# Patient Record
Sex: Female | Born: 1960 | Race: White | Hispanic: No | Marital: Married
Health system: Midwestern US, Academic
[De-identification: ages and names within clinical notes are randomized; demographics above are authoritative.]

---

## 2017-04-01 MED ORDER — TRIAMCINOLONE ACETONIDE 0.1 % TP OINT
Freq: Two times a day (BID) | TOPICAL | 3 refills | Status: DC
Start: 2017-04-01 — End: 2017-07-28

## 2017-04-05 MED ORDER — CARVEDILOL 12.5 MG PO TAB
ORAL_TABLET | Freq: Two times a day (BID) | ORAL | 0 refills | 90.00000 days | Status: DC
Start: 2017-04-05 — End: 2017-07-01

## 2017-04-11 MED ORDER — SULFAMETHOXAZOLE-TRIMETHOPRIM 800-160 MG PO TAB
1 | ORAL_TABLET | Freq: Two times a day (BID) | ORAL | 0 refills | Status: AC
Start: 2017-04-11 — End: ?

## 2017-04-28 MED ORDER — CIPROFLOXACIN HCL 500 MG PO TAB
500 mg | ORAL_TABLET | Freq: Two times a day (BID) | ORAL | 0 refills | 10.00000 days | Status: AC
Start: 2017-04-28 — End: ?

## 2017-04-28 MED ORDER — HYDROXYCHLOROQUINE 200 MG PO TAB
200 mg | ORAL_TABLET | Freq: Two times a day (BID) | ORAL | 3 refills | 90.00000 days | Status: DC
Start: 2017-04-28 — End: 2017-07-28

## 2017-06-07 ENCOUNTER — Encounter
Admit: 2017-06-07 | Discharge: 2017-06-07 | Payer: Private Health Insurance - Indemnity | Primary: Student in an Organized Health Care Education/Training Program

## 2017-06-07 ENCOUNTER — Ambulatory Visit
Admit: 2017-06-07 | Discharge: 2017-06-07 | Payer: Commercial Managed Care - HMO | Primary: Student in an Organized Health Care Education/Training Program

## 2017-06-07 DIAGNOSIS — L97529 Non-pressure chronic ulcer of other part of left foot with unspecified severity: ICD-10-CM

## 2017-06-07 DIAGNOSIS — K219 Gastro-esophageal reflux disease without esophagitis: ICD-10-CM

## 2017-06-07 DIAGNOSIS — E118 Type 2 diabetes mellitus with unspecified complications: Principal | ICD-10-CM

## 2017-06-07 DIAGNOSIS — I776 Arteritis, unspecified: ICD-10-CM

## 2017-06-07 DIAGNOSIS — E785 Hyperlipidemia, unspecified: ICD-10-CM

## 2017-06-07 DIAGNOSIS — I2510 Atherosclerotic heart disease of native coronary artery without angina pectoris: ICD-10-CM

## 2017-06-07 DIAGNOSIS — F329 Major depressive disorder, single episode, unspecified: ICD-10-CM

## 2017-06-07 DIAGNOSIS — F419 Anxiety disorder, unspecified: ICD-10-CM

## 2017-06-07 DIAGNOSIS — S62109A Fracture of unspecified carpal bone, unspecified wrist, initial encounter for closed fracture: ICD-10-CM

## 2017-06-07 DIAGNOSIS — Z9884 Bariatric surgery status: ICD-10-CM

## 2017-06-07 DIAGNOSIS — E1165 Type 2 diabetes mellitus with hyperglycemia: ICD-10-CM

## 2017-06-07 DIAGNOSIS — I10 Essential (primary) hypertension: ICD-10-CM

## 2017-06-07 DIAGNOSIS — T7840XA Allergy, unspecified, initial encounter: ICD-10-CM

## 2017-06-07 DIAGNOSIS — G709 Myoneural disorder, unspecified: ICD-10-CM

## 2017-06-07 DIAGNOSIS — Z9289 Personal history of other medical treatment: ICD-10-CM

## 2017-06-07 LAB — HEMOGLOBIN A1C: Lab: 5.5 % (ref 4.0–6.2)

## 2017-06-10 ENCOUNTER — Encounter
Admit: 2017-06-10 | Discharge: 2017-06-10 | Payer: Private Health Insurance - Indemnity | Primary: Student in an Organized Health Care Education/Training Program

## 2017-07-01 ENCOUNTER — Encounter
Admit: 2017-07-01 | Discharge: 2017-07-01 | Payer: Private Health Insurance - Indemnity | Primary: Student in an Organized Health Care Education/Training Program

## 2017-07-01 MED ORDER — CARVEDILOL 12.5 MG PO TAB
ORAL_TABLET | Freq: Two times a day (BID) | ORAL | 2 refills | 90.00000 days | Status: AC
Start: 2017-07-01 — End: ?

## 2017-07-13 ENCOUNTER — Encounter
Admit: 2017-07-13 | Discharge: 2017-07-13 | Payer: Private Health Insurance - Indemnity | Primary: Student in an Organized Health Care Education/Training Program

## 2017-07-14 ENCOUNTER — Encounter
Admit: 2017-07-14 | Discharge: 2017-07-14 | Payer: Private Health Insurance - Indemnity | Primary: Student in an Organized Health Care Education/Training Program

## 2017-07-14 NOTE — Telephone Encounter
Forwarded to Dr. Benedict Mahoney for review.  Delma Officeronda L Mahoney  Sara Nath, MD 23 hours ago (8:15 AM)        I need to have outpatient surgery on my achilles tendon. In order to have this done they are requesting cardiac clearance. I am hoping to have the procedure in the next few weeks so could you please at your earliest convenience send the clearance to:     [facility]   Lonia Bloodusty Christensen DPM   [address]   [address]   [place], [place] [id number]     Thank you,   Sara BealRonda Mahoney   [phone]

## 2017-07-18 ENCOUNTER — Inpatient Hospital Stay
Admit: 2017-07-18 | Discharge: 2017-07-18 | Payer: Private Health Insurance - Indemnity | Primary: Student in an Organized Health Care Education/Training Program

## 2017-07-18 ENCOUNTER — Encounter
Admit: 2017-07-18 | Discharge: 2017-07-18 | Payer: Private Health Insurance - Indemnity | Primary: Student in an Organized Health Care Education/Training Program

## 2017-07-18 ENCOUNTER — Emergency Department: Admit: 2017-07-18 | Discharge: 2017-07-18 | Payer: Private Health Insurance - Indemnity

## 2017-07-18 DIAGNOSIS — I10 Essential (primary) hypertension: ICD-10-CM

## 2017-07-18 LAB — COMPREHENSIVE METABOLIC PANEL
Lab: 0.5 mg/dL (ref 0.3–1.2)
Lab: 0.5 mg/dL (ref 0.4–1.00)
Lab: 10 mg/dL (ref 8.5–10.6)
Lab: 139 MMOL/L (ref 137–147)
Lab: 14 mg/dL (ref 7–25)
Lab: 24 U/L (ref 7–40)
Lab: 26 U/L (ref 7–56)
Lab: 28 MMOL/L (ref 21–30)
Lab: 4.4 g/dL (ref 3.5–5.0)
Lab: 7 10*3/uL (ref 3–12)
Lab: 8 g/dL (ref 6.0–8.0)
Lab: 81 U/L — ABNORMAL HIGH (ref 25–110)
Lab: 88 mg/dL (ref 70–100)
Lab: >60 mL/min (ref >60)
Lab: >60 mL/min (ref >60)

## 2017-07-18 LAB — POC TROPONIN: Lab: 0 ng/mL (ref 0.00–0.05)

## 2017-07-18 LAB — D-DIMER: Lab: 535 ng{FEU}/mL — ABNORMAL HIGH (ref <500)

## 2017-07-18 LAB — PROTIME INR (PT): Lab: 1 MMOL/L — ABNORMAL LOW (ref 0.8–1.2)

## 2017-07-18 LAB — PTT (APTT): Lab: 33 s (ref 21.0–39.0)

## 2017-07-18 LAB — CBC AND DIFF
Lab: 0 10*3/uL (ref 0–0.20)
Lab: 0.2 10*3/uL (ref 0–0.45)
Lab: 9.6 10*3/uL (ref 4.5–11.0)

## 2017-07-18 LAB — LIPASE: Lab: 16 U/L (ref 11–82)

## 2017-07-18 MED ORDER — MAGNESIUM SULFATE IN D5W 1 GRAM/100 ML IV PGBK
1 g | Freq: Once | INTRAVENOUS | 0 refills | Status: CP
Start: 2017-07-18 — End: ?
  Administered 2017-07-19: 05:00:00 1 g via INTRAVENOUS

## 2017-07-18 MED ORDER — IOPAMIDOL 76 % IV SOLN
75 mL | Freq: Once | INTRAVENOUS | 0 refills | Status: CP
Start: 2017-07-18 — End: ?
  Administered 2017-07-19: 04:00:00 75 mL via INTRAVENOUS

## 2017-07-18 MED ORDER — ENOXAPARIN 40 MG/0.4 ML SC SYRG
40 mg | Freq: Every day | SUBCUTANEOUS | 0 refills | Status: DC
Start: 2017-07-18 — End: 2017-07-19
  Administered 2017-07-19: 05:00:00 40 mg via SUBCUTANEOUS

## 2017-07-18 MED ORDER — MELATONIN 3 MG PO TAB
3 mg | Freq: Every evening | ORAL | 0 refills | Status: DC | PRN
Start: 2017-07-18 — End: 2017-07-19
  Administered 2017-07-19: 05:00:00 3 mg via ORAL

## 2017-07-18 MED ORDER — FERROUS SULFATE 300 MG (60 MG IRON)/5 ML PO LIQD
300 mg | Freq: Every day | ORAL | 0 refills | Status: DC
Start: 2017-07-18 — End: 2017-07-19
  Administered 2017-07-19: 15:00:00 300 mg via ORAL

## 2017-07-18 MED ORDER — BUPROPION XL 150 MG PO TB24
150 mg | Freq: Every day | ORAL | 0 refills | Status: DC
Start: 2017-07-18 — End: 2017-07-19
  Administered 2017-07-19: 15:00:00 150 mg via ORAL

## 2017-07-18 MED ORDER — ASCORBIC ACID (VITAMIN C) 500 MG PO TAB
500 mg | Freq: Every day | ORAL | 0 refills | Status: DC
Start: 2017-07-18 — End: 2017-07-19
  Administered 2017-07-19: 15:00:00 500 mg via ORAL

## 2017-07-18 MED ORDER — PANTOPRAZOLE 40 MG PO TBEC
80 mg | Freq: Every day | ORAL | 0 refills | Status: CN
Start: 2017-07-18 — End: ?

## 2017-07-18 MED ORDER — LOSARTAN 25 MG PO TAB
25 mg | Freq: Every day | ORAL | 0 refills | Status: DC
Start: 2017-07-18 — End: 2017-07-19

## 2017-07-18 MED ORDER — POTASSIUM CHLORIDE 20 MEQ PO TBTQ
60 meq | Freq: Once | ORAL | 0 refills | Status: CP
Start: 2017-07-18 — End: ?
  Administered 2017-07-19: 05:00:00 60 meq via ORAL

## 2017-07-18 MED ORDER — SODIUM CHLORIDE 0.9 % IJ SOLN
50 mL | Freq: Once | INTRAVENOUS | 0 refills | Status: CP
Start: 2017-07-18 — End: ?
  Administered 2017-07-19: 04:00:00 50 mL via INTRAVENOUS

## 2017-07-18 MED ORDER — CARVEDILOL 12.5 MG PO TAB
12.5 mg | Freq: Two times a day (BID) | ORAL | 0 refills | Status: DC
Start: 2017-07-18 — End: 2017-07-19

## 2017-07-18 MED ORDER — HYDRALAZINE 20 MG/ML IJ SOLN
10 mg | INTRAVENOUS | 0 refills | Status: DC | PRN
Start: 2017-07-18 — End: 2017-07-19

## 2017-07-18 MED ORDER — ASPIRIN 81 MG PO TBEC
81 mg | Freq: Every day | ORAL | 0 refills | Status: DC
Start: 2017-07-18 — End: 2017-07-19

## 2017-07-18 MED ORDER — FUROSEMIDE 40 MG PO TAB
20 mg | Freq: Every morning | ORAL | 0 refills | Status: CN
Start: 2017-07-18 — End: ?

## 2017-07-18 MED ORDER — ATORVASTATIN 40 MG PO TAB
40 mg | Freq: Every day | ORAL | 0 refills | Status: DC
Start: 2017-07-18 — End: 2017-07-19
  Administered 2017-07-19: 15:00:00 40 mg via ORAL

## 2017-07-18 MED ORDER — CALCIUM CARBONATE-VITAMIN D3 500 MG(1,250MG) -200 UNIT PO TAB
1 | Freq: Three times a day (TID) | ORAL | 0 refills | Status: DC
Start: 2017-07-18 — End: 2017-07-19
  Administered 2017-07-19 (×2): 1 via ORAL

## 2017-07-18 MED ORDER — FAMOTIDINE 20 MG PO TAB
20 mg | Freq: Two times a day (BID) | ORAL | 0 refills | Status: DC | PRN
Start: 2017-07-18 — End: 2017-07-19

## 2017-07-18 MED ORDER — ASPIRIN 81 MG PO CHEW
324 mg | Freq: Once | ORAL | 0 refills | Status: CP
Start: 2017-07-18 — End: ?
  Administered 2017-07-18: 22:00:00 324 mg via ORAL

## 2017-07-18 MED ORDER — ACETAMINOPHEN/LIDOCAINE/ANTACID DS(#) 1:1:3  PO SUSP
30 mL | Freq: Once | ORAL | 0 refills | Status: CP
Start: 2017-07-18 — End: ?
  Administered 2017-07-18: 22:00:00 30 mL via ORAL

## 2017-07-18 MED ORDER — POTASSIUM CHLORIDE 10 MEQ PO TBTQ
10 meq | Freq: Every day | ORAL | 0 refills | Status: CN
Start: 2017-07-18 — End: ?

## 2017-07-18 MED ORDER — ACETAMINOPHEN 325 MG PO TAB
650 mg | ORAL | 0 refills | Status: DC | PRN
Start: 2017-07-18 — End: 2017-07-19

## 2017-07-18 MED ORDER — HYDROXYCHLOROQUINE 200 MG PO TAB
200 mg | Freq: Two times a day (BID) | ORAL | 0 refills | Status: DC
Start: 2017-07-18 — End: 2017-07-19

## 2017-07-18 MED ORDER — CARVEDILOL 12.5 MG PO TAB
12.5 mg | Freq: Two times a day (BID) | ORAL | 0 refills | Status: DC
Start: 2017-07-18 — End: 2017-07-19
  Administered 2017-07-19 (×2): 12.5 mg via ORAL

## 2017-07-18 MED ORDER — MULTIVITAMIN PO TAB
1 | Freq: Every day | ORAL | 0 refills | Status: DC
Start: 2017-07-18 — End: 2017-07-19
  Administered 2017-07-19: 15:00:00 1 via ORAL

## 2017-07-18 NOTE — ED Notes
Dr. Norvell at bedside

## 2017-07-19 ENCOUNTER — Inpatient Hospital Stay
Admit: 2017-07-19 | Discharge: 2017-07-19 | Payer: Private Health Insurance - Indemnity | Primary: Student in an Organized Health Care Education/Training Program

## 2017-07-19 ENCOUNTER — Inpatient Hospital Stay
Admit: 2017-07-18 | Discharge: 2017-07-19 | Disposition: A | Discharge status: Home / Self Care | Payer: Commercial Managed Care - HMO

## 2017-07-19 DIAGNOSIS — Z98891 History of uterine scar from previous surgery: ICD-10-CM

## 2017-07-19 DIAGNOSIS — Z9884 Bariatric surgery status: ICD-10-CM

## 2017-07-19 DIAGNOSIS — F329 Major depressive disorder, single episode, unspecified: ICD-10-CM

## 2017-07-19 DIAGNOSIS — F419 Anxiety disorder, unspecified: ICD-10-CM

## 2017-07-19 DIAGNOSIS — R791 Abnormal coagulation profile: ICD-10-CM

## 2017-07-19 DIAGNOSIS — I119 Hypertensive heart disease without heart failure: ICD-10-CM

## 2017-07-19 DIAGNOSIS — I4891 Unspecified atrial fibrillation: ICD-10-CM

## 2017-07-19 DIAGNOSIS — I4510 Unspecified right bundle-branch block: ICD-10-CM

## 2017-07-19 DIAGNOSIS — Z9851 Tubal ligation status: ICD-10-CM

## 2017-07-19 DIAGNOSIS — E119 Type 2 diabetes mellitus without complications: ICD-10-CM

## 2017-07-19 DIAGNOSIS — Z951 Presence of aortocoronary bypass graft: ICD-10-CM

## 2017-07-19 DIAGNOSIS — E663 Overweight: ICD-10-CM

## 2017-07-19 DIAGNOSIS — Z9049 Acquired absence of other specified parts of digestive tract: ICD-10-CM

## 2017-07-19 DIAGNOSIS — E785 Hyperlipidemia, unspecified: ICD-10-CM

## 2017-07-19 DIAGNOSIS — I2510 Atherosclerotic heart disease of native coronary artery without angina pectoris: ICD-10-CM

## 2017-07-19 DIAGNOSIS — K219 Gastro-esophageal reflux disease without esophagitis: ICD-10-CM

## 2017-07-19 DIAGNOSIS — I493 Ventricular premature depolarization: ICD-10-CM

## 2017-07-19 DIAGNOSIS — R079 Chest pain, unspecified: Principal | ICD-10-CM

## 2017-07-19 DIAGNOSIS — R0602 Shortness of breath: ICD-10-CM

## 2017-07-19 DIAGNOSIS — Z6828 Body mass index (BMI) 28.0-28.9, adult: ICD-10-CM

## 2017-07-19 DIAGNOSIS — I776 Arteritis, unspecified: ICD-10-CM

## 2017-07-19 LAB — POC TROPONIN: Lab: 0 ng/mL (ref 0.00–0.05)

## 2017-07-19 LAB — TROPONIN-I

## 2017-07-19 LAB — POC GLUCOSE: Lab: 84 mg/dL — AB (ref 70–100)

## 2017-07-19 LAB — PHOSPHORUS
Lab: 3.8 mg/dL (ref 2.0–4.0)
Lab: 4.1 mg/dL — ABNORMAL HIGH (ref >60)

## 2017-07-19 LAB — MAGNESIUM
Lab: 1.9 mg/dL (ref 1.6–2.6)
Lab: 2.2 mg/dL — ABNORMAL LOW (ref 1.6–2.6)

## 2017-07-19 LAB — CBC AND DIFF: Lab: 8.5 K/UL — ABNORMAL HIGH (ref 4.5–11.0)

## 2017-07-19 LAB — COMPREHENSIVE METABOLIC PANEL: Lab: 139 MMOL/L — ABNORMAL LOW (ref 137–147)

## 2017-07-19 MED ORDER — NITROGLYCERIN 0.4 MG SL SUBL
.4 mg | SUBLINGUAL | 0 refills | Status: DC | PRN
Start: 2017-07-19 — End: 2017-07-19

## 2017-07-19 MED ORDER — SODIUM CHLORIDE 0.9 % IV SOLP
250 mL | INTRAVENOUS | 0 refills | Status: DC | PRN
Start: 2017-07-19 — End: 2017-07-19

## 2017-07-19 MED ORDER — REGADENOSON 0.4 MG/5 ML IV SYRG
.4 mg | Freq: Once | INTRAVENOUS | 0 refills | Status: CP
Start: 2017-07-19 — End: ?
  Administered 2017-07-19: 14:00:00 0.4 mg via INTRAVENOUS

## 2017-07-19 MED ORDER — AMINOPHYLLINE 500 MG/20 ML IV SOLN
50 mg | INTRAVENOUS | 0 refills | Status: DC | PRN
Start: 2017-07-19 — End: 2017-07-19

## 2017-07-19 MED ORDER — LOSARTAN 25 MG PO TAB
25 mg | ORAL_TABLET | Freq: Every day | ORAL | 1 refills | 30.00000 days | Status: AC
Start: 2017-07-19 — End: 2017-09-26

## 2017-07-19 MED ORDER — ALBUTEROL SULFATE 90 MCG/ACTUATION IN HFAA
2 | RESPIRATORY_TRACT | 0 refills | Status: DC | PRN
Start: 2017-07-19 — End: 2017-07-19

## 2017-07-19 NOTE — Progress Notes
RESPIRATORY THERAPY  ADULT PROTOCOL EVALUATION      RESPIRATORY PROTOCOL PLAN    Medications       Note: If indicated by protocol, medication orders will be placed by therapist.    Procedures       Comment: criteria not met  _____________________________________________________________    PATIENT EVALUATION RESULTS    Chart Review  * Pulmonary Hx: No pulmonary diagnosis OR no smoking hx    * Surgical Hx: No surgery OR last surgery > 6 weeks ago OR trach/stoma (BA)    * Chest X-Ray: Clear OR not available    * PFT/Oxygenation: FEV1, PEFR > 80% predicted OR physically unable to perform OR Pa02 >80 RA OR Sp02 >95% RA      Patient Assessment  * Respiratory Pattern: Regular pattern and rate OR good chest excursion with deep breathing    * Breath Sounds: Clear and able to auscultate bases posteriorly    * Cough / Sputum: Strong, effective cough OR nonproductive    * Mental Status: Alert, oriented, cooperative    * Activity Level: Ambulatory      Priority Index  Total Points: 0 Points  * Priority Index: Criteria not met    PRIORITY INDEX GUIDELINES*  Priority Points   1 0-9 points   2 9-18 points   3 > 18 points   + Pulm Dx or Home Rx   *Higher points indicate higher acuity.      Therapist: Annye English, RT  Date: 07/19/2017      Key  AC = Airway clearance  AM = Aerosolized medication  BA = Bland aerosol  DB&C = Deep breathe & cough  FEV1 = Forced expiratory volume in first second)  IC = Inspiratory capacity  LE = Lung expansion  MDI = Metered dose inhaler  Neb = Nebulizer  O2 = Oxygen  Oxim =Oximetry  PEFR = Peak expiratory flow rate  RRT = Rapid Response Team

## 2017-07-19 NOTE — ED Notes
[id number] @ 2213. Please call Annette StableBill, RN at [phone].

## 2017-07-19 NOTE — Progress Notes
General Progress Note    Name:  Sara Mahoney   [id number] Date:  07/19/2017  Admission Date: 07/18/2017  LOS: 1 day                     Assessment/Plan:    Principal Problem:    Chest pain  Active Problems:    Diabetes mellitus, type II (HCC)    Essential hypertension with goal blood pressure less than 140/90    Coronary artery disease involving native coronary artery of native heart without angina pectoris    HLD (hyperlipidemia)    Depression    Atrial fibrillation (HCC)    Overweight    Anxiety    Sara Mahoney is an 56 y.o. female admitted for chest pain, ACS rule out.  ???  PMH/Comorbidities: CAD S/P CABG, atrial fibrillation, type 2 diabetes (A1c 5.5), obesity S/P gastric bypass currently overweight, hypertension, hyperlipidemia, ocular complications of diabetes, depression, anxiety, GERD, small vessel vasculitis, neuromuscular disorder.  ???  Allergies: Hazelnut; Levaquin [levofloxacin]; Lisinopril; and Nsaids (non-steroidal anti-inflammatory drug)  ???  Chest pain -- R/O ACS  CAD s/p CABG  HLD  - On admit troponin negative ???4 at 1603, 1858, 0126, 0650  - Chest x-ray without acute cardiopulmonary abnormality  - EKG with normal sinus rhythm.  Auto interpreted as ventricular bigeminy, but believe this only represents PVCs.  Previously observed right bundle branch block.  HR 57.  QTc 455.  No STEMI.  - 02/19/2014 Echocardiogram LVEF 60%, normal diastolic function, no valvular abnormalities  - MPI regadenoson stress test 05/09/2015 normal.  - 12/24/2008 Left heart cath.  Severe coronary artery disease, LVEF 60%.  Normal LVEDP.  Referred to cardiothoracic surgery as a result of this procedure to evaluate for CABG.  - Repeat echocardiogram 07/19/2017: Normal left ventricular function, EF 55% - normal wall motion. Normal chamber dimensions. Nodular thickening on the aortic valve without stenosis or insufficiency. Normal diastolic function with an estimated peak PAP ~ 21mm Hg. No pericardial effusion - Cardiology consulted, appreciate recommendations               > Follow up cardiology recommendations               > Telemetry               >  reg thal MPI stress test today               > Cont PTA ASA, atorvastatin               > Monitor and maintain Mg > 2, K > 4               > H2 blocker PRN  ???  Elevated D-Dimer  - D-Dimer > 500 and essentially equal to age-adjusted cutoff  - Reported abnormally-palpable LE blood vessel 07/17/2017 with pain, resolved by time of admit  - CTA Chest: No evidence of acute pulmonary embolism, no consolidating pneumonia or pleural effusion. Prior median sternotomy and CABG with dense native coronary artery calcification. Prior gastric bypass with small hiatal hernia.  - Bilateral LE venous doppler: No evidence of acute femoral/popliteal DVT in the bilateral lower extremities  ???  T2DM -- well controlled, diet controlled  - Last A1c 5.5 on 05/27/2017               > Monitor glucose AM  ???  HTN  - Hypertensive in ED  - Was on lisinopril in past,  but now has intolerance (cough)               > Cont PTA carvedilol               > Continue losartan 25mg  po daily, may require dose titraiton               > Hydralazine 10mg  IV PRN SBP > 180, DBP > 110  ???  Atrial fibrillation -- Postoperative, CHA2DS2-VASc score of 3  - Not on anticoagulation PTA; may not be indicated if only postoperative               > Telemetry               > Cont PTA carvedilol               > Consider need for systemic anticoagulation  ???  Depression / Anxiety               > Cont PTA buproprion  ???  Obesity s/p gastric bypass               > Encourage further weight loss on outpatient basis               > Cont PTA iron, vitamin C, multivitamin, calcium, Vit D3  ???  GERD  - Not using PPI               > H2 blocker PRN  ???  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  FEN:               > Diet: NPO for stress test then Cardiac diet               > IVF: Not indicated               > Monitor and replace electrolytes as needed  ???  ACCESS:  Lines: PIV  Urinary Catether: Not indicated  ???  Prophylaxis Review:  DVT prophylaxis    SCD, enoxaparin  GI Prophylaxis:  Not indicated  PT/OT: Ordered  Wounds: None noted   ???  Code Status:  Full code  ???  Disposition: Continue admission with family medicine for chest pain ACS rule out. Possible discharge later today pending cardiology recommendations and stress test results.  ???  Patient discussed and evaluated by staff physician Dr. Kate Sable  ???  Manfred Arch, APRN-NP  [facility]  Team Pager [phone]  ________________________________________________________________________    Subjective  Sara Mahoney is a 56 y.o. female.  Patient resting in bed with husband at bedside, in no acute distress. Ms. Lorio denies chest pain this morning. The results of her echocardiogram were discussed. The plan of care to consult with cardiology and have her complete a stress test were also discussed. If results are normal, she will likely be discharged home. Ms. Minch was in agreement and denied questions at this time.     Medications  Scheduled Meds:  ascorbic acid (VITAMIN C) tablet 500 mg 500 mg Oral QDAY  aspirin EC tablet 81 mg 81 mg Oral QDAY   atorvastatin (LIPITOR) tablet 40 mg 40 mg Oral QDAY   buPROPion XL (WELLBUTRIN XL) tablet 150 mg 150 mg Oral QDAY   calcium carbonate/vitamin D-3 (OSCAL-500+D) 1250 mg/200 unit tablet 1 tablet 1 tablet Oral TID   carvedilol (COREG) tablet 12.5 mg 12.5 mg Oral BID w/meals enoxaparin (LOVENOX) syringe 40 mg 40 mg Subcutaneous QDAY(21)   ferrous sulfate oral solution 300 mg 300 mg Oral QDAY   hydroxychloroquine (PLAQUENIL) tablet 200 mg 200 mg Oral BID   losartan (COZAAR) tablet 25 mg 25 mg Oral QDAY   vitamins, multiple tablet 1 tablet 1 tablet Oral QDAY   Continuous Infusions:  PRN and Respiratory Meds:acetaminophen Q6H PRN, aminophylline PRN, famotidine BID PRN, hydrALAZINE Q6H PRN, melatonin QHS PRN, nitroglycerin PRN, sodium chloride 0.9% (NS) PRN      Review of Systems:  Constitutional: negative  Respiratory: negative for cough, increased work of breathing or pleurisy/chest pain  Cardiovascular: negative for chest pain, chest pressure/discomfort, dyspnea, tachypnea  Gastrointestinal: negative  Genitourinary:negative  Integument/breast: negative  Musculoskeletal:negative  Neurological: negative    Objective:                          Vital Signs: Last Filed                 Vital Signs: 24 Hour Range   BP: 121/58 (07/24 0745)  Temp: 36.6 ???C (97.8 ???F) (07/24 0745)  Pulse: 52 (07/24 0745)  Respirations: 18 PER MINUTE (07/24 0745)  SpO2: 99 % (07/24 0745)  O2 Delivery: None (Room Air) (07/24 0745)  SpO2 Pulse: 56 (07/23 2230)  Height: 162.6 cm (64) (07/24 0745) BP: (107-187)/(50-103)   Temp:  [36.4 ???C (97.5 ???F)-36.6 ???C (97.8 ???F)]   Pulse:  [52-66]   Respirations:  [0 PER MINUTE-18 PER MINUTE]   SpO2:  [97 %-100 %]   O2 Delivery: None (Room Air)     Vitals:    07/18/17 1352 07/18/17 2255 07/19/17 0745   Weight: 74.8 kg (165 lb) 75.7 kg (166 lb 14.2 oz) 74.8 kg (165 lb)       Intake/Output Summary:  (Last 24 hours)    Intake/Output Summary (Last 24 hours) at 07/19/17 0930  Last data filed at 07/19/17 0426   Gross per 24 hour   Intake                0 ml   Output                0 ml   Net                0 ml      Stool Occurrence: 0    Physical Exam  - General: No acute distress. Well appearing.  - Eyes: EOMI. No scleral icterus or injection. Eyelids symmetric without ptosis. - HEENT: Atraumatic, normocephalic. Oropharynx clear. Mucous membranes moist without ulceration.   - CV: S1, S2 normal with normal rate without rub, gallop or murmur. Distal pulses intact. Trace bilateral LE edema.No calf tenderness.   - Resp: CTA bilat. with normal respiratory effort. Symmetric expansion upon inspiration.   - GI: Soft, non-tender, non-distended without mass or guarding. Normal bowel sounds.   - Skin: Warm, moist, normal turgor. Cap refill < 3 sec. No rash appreciated.   - Neuro: Alert and oriented  - Psych: Dressed in hospital clothes. Appropriately groomed. Calm and cooperative.Good judgment/insight.  Lab Review  24-hour labs:    Results for orders placed or performed during the hospital encounter of 07/18/17 (from the past 24 hour(s))   CBC AND DIFF    Collection Time: 07/18/17  4:00 PM   Result Value Ref Range    White Blood Cells 9.6 4.5 - 11.0 K/UL    RBC 4.25 4.0 - 5.0 M/UL    Hemoglobin 12.4 12.0 - 15.0 GM/DL    Hematocrit 09.8 36 - 45 %    MCV 87.8 80 - 100 FL    MCH 29.3 26 - 34 PG    MCHC 33.3 32.0 - 36.0 G/DL    RDW 11.9 11 - 15 %    Platelet Count 315 150 - 400 K/UL    MPV 8.4 7 - 11 FL    Neutrophils 47 41 - 77 %    Lymphocytes 45 (H) 24 - 44 %    Monocytes 5 4 - 12 %    Eosinophils 2 0 - 5 %    Basophils 1 0 - 2 %    Absolute Neutrophil Count 4.50 1.8 - 7.0 K/UL    Absolute Lymph Count 4.30 1.0 - 4.8 K/UL    Absolute Monocyte Count 0.50 0 - 0.80 K/UL    Absolute Eosinophil Count 0.20 0 - 0.45 K/UL    Absolute Basophil Count 0.00 0 - 0.20 K/UL   COMPREHENSIVE METABOLIC PANEL    Collection Time: 07/18/17  4:00 PM   Result Value Ref Range    Sodium 139 137 - 147 MMOL/L    Potassium 3.4 (L) 3.5 - 5.1 MMOL/L    Chloride 104 98 - 110 MMOL/L    Glucose 88 70 - 100 MG/DL    Blood Urea Nitrogen 14 7 - 25 MG/DL    Creatinine 1.47 0.4 - 1.00 MG/DL    Calcium 82.9 8.5 - 56.2 MG/DL    Total Protein 8.0 6.0 - 8.0 G/DL    Total Bilirubin 0.5 0.3 - 1.2 MG/DL    Albumin 4.4 3.5 - 5.0 G/DL Alk Phosphatase 81 25 - 110 U/L    AST (SGOT) 24 7 - 40 U/L    CO2 28 21 - 30 MMOL/L    ALT (SGPT) 26 7 - 56 U/L    Anion Gap 7 3 - 12    eGFR Non African American >60 >60 mL/min    eGFR African American >60 >60 mL/min   PROTIME INR (PT)    Collection Time: 07/18/17  4:00 PM   Result Value Ref Range    INR 1.0 0.8 - 1.2   PTT (APTT)    Collection Time: 07/18/17  4:00 PM   Result Value Ref Range    APTT 33.1 21.0 - 39.0 SEC   D-DIMER    Collection Time: 07/18/17  4:00 PM   Result Value Ref Range    D-Dimer 535 (H) <500 ng/mL FEU   LIPASE    Collection Time: 07/18/17  4:00 PM   Result Value Ref Range    Lipase 16 11 - 82 U/L   MAGNESIUM    Collection Time: 07/18/17  4:00 PM   Result Value Ref Range    Magnesium 1.9 1.6 - 2.6 mg/dL   PHOSPHORUS    Collection Time: 07/18/17  4:00 PM   Result Value Ref Range    Phosphorus 3.8 2.0 - 4.0 MG/DL   POC TROPONIN    Collection Time: 07/18/17  4:03 PM   Result Value Ref  Range    Troponin-I-POC 0.01 0.00 - 0.05 NG/ML   POC TROPONIN    Collection Time: 07/18/17  6:58 PM   Result Value Ref Range    Troponin-I-POC 0.00 0.00 - 0.05 NG/ML   POC GLUCOSE    Collection Time: 07/18/17 11:03 PM   Result Value Ref Range    Glucose, POC 84 70 - 100 MG/DL   TROPONIN-I    Collection Time: 07/19/17  1:26 AM   Result Value Ref Range    Troponin-I <0.01 0.0 - 0.05 NG/ML   CBC AND DIFF    Collection Time: 07/19/17  4:41 AM   Result Value Ref Range    White Blood Cells 8.5 4.5 - 11.0 K/UL    RBC 3.79 (L) 4.0 - 5.0 M/UL    Hemoglobin 11.1 (L) 12.0 - 15.0 GM/DL    Hematocrit 16.1 (L) 36 - 45 %    MCV 88.3 80 - 100 FL    MCH 29.3 26 - 34 PG    MCHC 33.2 32.0 - 36.0 G/DL    RDW 09.6 11 - 15 %    Platelet Count 268 150 - 400 K/UL    MPV 8.2 7 - 11 FL    Neutrophils 48 41 - 77 %    Lymphocytes 42 24 - 44 %    Monocytes 6 4 - 12 %    Eosinophils 3 0 - 5 %    Basophils 1 0 - 2 %    Absolute Neutrophil Count 4.20 1.8 - 7.0 K/UL    Absolute Lymph Count 3.60 1.0 - 4.8 K/UL Absolute Monocyte Count 0.50 0 - 0.80 K/UL    Absolute Eosinophil Count 0.20 0 - 0.45 K/UL    Absolute Basophil Count 0.10 0 - 0.20 K/UL   COMPREHENSIVE METABOLIC PANEL    Collection Time: 07/19/17  4:41 AM   Result Value Ref Range    Sodium 139 137 - 147 MMOL/L    Potassium 4.2 3.5 - 5.1 MMOL/L    Chloride 107 98 - 110 MMOL/L    Glucose 82 70 - 100 MG/DL    Blood Urea Nitrogen 11 7 - 25 MG/DL    Creatinine 0.45 0.4 - 1.00 MG/DL    Calcium 9.4 8.5 - 40.9 MG/DL    Total Protein 6.4 6.0 - 8.0 G/DL    Total Bilirubin 0.4 0.3 - 1.2 MG/DL    Albumin 3.6 3.5 - 5.0 G/DL    Alk Phosphatase 61 25 - 110 U/L    AST (SGOT) 25 7 - 40 U/L    CO2 26 21 - 30 MMOL/L    ALT (SGPT) 23 7 - 56 U/L    Anion Gap 6 3 - 12    eGFR Non African American >60 >60 mL/min    eGFR African American >60 >60 mL/min   MAGNESIUM    Collection Time: 07/19/17  4:41 AM   Result Value Ref Range    Magnesium 2.2 1.6 - 2.6 mg/dL   PHOSPHORUS    Collection Time: 07/19/17  4:41 AM   Result Value Ref Range    Phosphorus 4.1 (H) 2.0 - 4.0 MG/DL   TROPONIN-I    Collection Time: 07/19/17  6:50 AM   Result Value Ref Range    Troponin-I <0.01 0.0 - 0.05 NG/ML       Point of Care Testing  (Last 24 hours)  Glucose: 82 (07/19/17 0441)  POC Glucose (Download): 84 (07/18/17 2303)  Urine Pregnancy: (S) Negative (  07/18/17 1820)    Radiology and other Diagnostics Review:    Pertinent radiology reviewed.    Oda Cogan, APRN-NP   Pager

## 2017-07-19 NOTE — Progress Notes
Sara Mahoney discharged on 07/19/2017.  Equipment Removed: Telepack.  Discharge instructions reviewed with patient.  Valuables returned:   Where Are Valuables Stored?: bedside.  Home medications:    n/a  Functional assessment at discharge complete: Yes .  Patient was read and provided with discharge instructions. All questions and concerns addressed. Peripheral IV removed. Wheelchair to lobby after patient has dinner.

## 2017-07-19 NOTE — ED Notes
Report to Bill, RN.

## 2017-07-19 NOTE — Progress Notes
Patient arrived to room # [phone]([id number]) via wheelchair accompanied by transport. Patient transferred to the bed without assistance. Bedside safety checks completed. Initial patient assessment completed, refer to flowsheet for details. Admission skin assessment completed by:     Pressure Injury Present on Hospital Admission (within 24 hours): No    1. Occiput: No  2. Ear: No  3. Scapula: No  4. Spinous Process: No  5. Shoulder: No  6. Elbow: No  7. Iliac Crest: No  8. Sacrum/Coccyx: No  9. Ischial Tuberosity: No  10. Trochanter: No  11. Knee: No  12. Malleolus: No  13. Heel: No  14. Toes: No  15. Assessed for device associated injury Yes  16. Nursing Nutrition Assessment Completed Yes    See Doc Flowsheet for additional wound details.     INTERVENTIONS:

## 2017-07-19 NOTE — H&P (View-Only)
Admission History and Physical Examination    Today's Date: 07/18/2017   Name: Sara Mahoney   MRN: [id number]   Admission  Date: 07/18/2017                      Assessment/Plan:    Principal Problem:    Chest pain  Active Problems:    Diabetes mellitus, type II (HCC)    Essential hypertension with goal blood pressure less than 140/90    Coronary artery disease involving native coronary artery of native heart without angina pectoris    HLD (hyperlipidemia)    Depression    Atrial fibrillation (HCC)    Overweight    Anxiety      Sara Mahoney is an 56 y.o. female admitted for chest pain, ACS rule out.    PMH/Comorbidities: CAD S/P CABG, atrial fibrillation, type 2 diabetes (A1c 5.5), obesity S/P gastric bypass currently overweight, hypertension, hyperlipidemia, ocular complications of diabetes, depression, anxiety, GERD, small vessel vasculitis, neuromuscular disorder.    Allergies: Hazelnut; Levaquin [levofloxacin]; Lisinopril; and Nsaids (non-steroidal anti-inflammatory drug)    Chest pain -- R/O ACS  CAD s/p CABG  HLD  - On admit troponin negative ???2 at 1603, 1858.  - Chest x-ray without acute cardiopulmonary abnormality  - EKG with normal sinus rhythm.  Auto interpreted as ventricular bigeminy, but believe this only represents PVCs.  Previously observed right bundle branch block.  HR 57.  QTc 455.  No STEMI.  - 02/19/2014 Echocardiogram LVEF 60%, normal diastolic function, no valvular abnormalities  - MPI regadenoson stress test 05/09/2015 normal.  - 12/24/2008 Left heart cath.  Severe coronary artery disease, LVEF 60%.  Normal LVEDP.  Referred to cardiothoracic surgery as a result of this procedure to evaluate for CABG.   > Consult cardiology, appreciate recommendations   > Telemetry   > Repeat 2D echo, reg thal MPI stress test   > Troponin q6h x 2 determinations   > Cont PTA ASA, atorvastatin   > Monitor and maintain Mg > 2, K > 4   > H2 blocker PRN    Elevated D-Dimer - D-Dimer > 500 and essentially equal to age-adjusted cutoff  - Reported abnormally-palpable LE blood vessel 07/17/2017 with pain, resolved by time of admit   > CTA Chest, Bilateral LE venous doppler to R/O VTE    T2DM -- well controlled, diet controlled  - Last A1c 5.5 on 05/27/2017   > Monitor glucose AM    HTN  - Hypertensive in ED  - Was on lisinopril in past, but now has intolerance (cough)   > Cont PTA carvedilol   > Begin losartan 25mg  po daily, may require dose titraiton   > Hydralazine 10mg  IV PRN SBP > 180, DBP > 110    Atrial fibrillation -- Postoperative, CHA2DS2-VASc score of 3  - Not on anticoagulation PTA; may not be indicated if only postoperative   > Telemetry   > Cont PTA carvedilol   > Consider need for systemic anticoagulation    Depression / Anxiety   > Cont PTA buproprion    Obesity s/p gastric bypass   > Encourage further weight loss on outpatient basis   > Cont PTA iron, vitamin C, multivitamin, calcium, Vit D3    GERD  - Not using PPI   > H2 blocker PRN    IgA vasculitis  - Previously with rash, currently in remission  - 04/01/2017 Surgical pathology demonstrating left thigh small vessel vasculitis, 2+  IgA vascular deposits, trace IgM/C3 vascular deposits, IgG deposits  - Follows with rheumatology, per their notes last evaluated 04/28/2017 and 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 distribution and plan for Plaquenil   > Cont PTA hydroxychloroquine    FEN:   > Diet: Cardiac without caffeine then NPO at midnight   > IVF: Not indicated   > Monitor and replace electrolytes as needed    ACCESS:  Lines: PIV  Urinary Catether: Not indicated    Prophylaxis Review:  DVT prophylaxis    SCD, enoxaparin  GI Prophylaxis:  Not indicated  PT/OT: Ordered  Wounds: None noted on initial physician survey; RN to survey per floor protocol    Code Status:  Full code    Disposition: Admit to Family Medicine    To be discussed and evaluated by staff physician Sara Daft, MD, on rounds 07/19/2017, and overnight as clinically indicated.    Sara Mahoney, M.D. Ph.D.  Resident Physician, PGY-3  Department of Family Medicine  [facility]  Pager [phone]  __________________________________________________________________________________  Primary Care Physician: Sara Mahoney  Verified    Chief Complaint: Dyspnea, heartburn    History of Present Illness: Sara Mahoney is a 56 y.o. female with PMH/cormobidities including  CAD S/P CABG, atrial fibrillation, type 2 diabetes (A1c 5.5), obesity S/P gastric bypass currently overweight, hypertension, hyperlipidemia, ocular complications of diabetes, depression, anxiety, GERD, small vessel vasculitis, neuromuscular disorder.    Presented to the emergency department 07/18/2017 with complaint of dyspnea, heartburn beginning 1030.  States exacerbated by drinking coffee containing espresso.  Associated with chest pressure, worsened by inspiration.  Recent history of gastric bypass 6 months previously.  Also recently stopped taking aspirin, but is been advised by cardiology to resume.  Last seen in cardiology clinic 03/16/2017.  Underwent CABG in 2009. No change in activities the past few days. Pain resolved by time I interviewed her in the ED.  Chest pain had cramping quality, moderate severity. Some exertional dyspnea associated.    On 07/17/2017 she noticed LE pain with what she was concerned for was a palpable blood vessel, now resolved.    Current (ED) laboratory/diagnostic evaluation  ??? Vitals afebrile.  Borderline bradycardic pulse 59-63, normal respirations, normoxic on room air.  Moderate hypertension (systolics 154-187).  ??? CBC essentially unremarkable.  ??? INR 1.0.  ??? CMP with potassium 3.4.  Magnesium not determined.  Liver enzymes normal.  Lipase normal.  ??? Troponin negative ???2 at 1603, 1858.  ??? Chest x-ray without acute cardiopulmonary abnormality  ??? EKG with normal sinus rhythm.  Auto interpreted as ventricular bigeminy, but believe this only represents PVCs.  Previously observed right bundle branch block.  HR 57.  QTc 455.  No STEMI.    Relevant prior laboratory/diagnostic evaluation  ??? 02/19/2014 Echocardiogram LVEF 60%, normal diastolic function, no valvular abnormalities  ??? MPI regadenoson stress test 05/09/2015 normal.  ??? 12/24/2008 Left heart cath.  Severe coronary artery disease, LVEF 60%.  Normal LVEDP.  Referred to cardiothoracic surgery as a result of this procedure to evaluate for CABG.  ??? 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 Admission was requested in view of cardiac  comorbidities to R/O ACS by troponin monitoring, echocardiogram, inpatient stress test.      Past Medical History:   Diagnosis Date   ??? Allergy    ??? Anxiety disorder    ??? CAD (coronary artery disease) 12/24/2008   ??? Depression 12/24/2008   ??? Diabetes mellitus type II, uncontrolled (HCC) 02/07/2008   ??? GERD (gastroesophageal reflux disease)    ??? History of blood transfusion    ??? HLD (hyperlipidemia) 12/24/2008   ??? HX Wrist fracture (right)    ??? Hypertension 02/07/2008   ??? Hypertensive heart disease    ??? Neuromuscular disorder (HCC)    ??? Small vessel vasculitis (HCC)      Past Surgical History:   Procedure Laterality Date   ??? HX WISDOM TEETH EXTRACTION  1992   ??? HX TUBAL LIGATION  1992   ??? HX GASTRIC BYPASS  2017   ??? HX BREAST REDUCTION     ??? HX CESAREAN SECTION     ??? HX CHOLECYSTECTOMY     ??? HX CORONARY ARTERY BYPASS GRAFT      x5   ??? HX TUBAL LIGATION       Family History   Problem Relation Age of Onset   ??? Heart Attack Father    ??? Early Death Father    ??? Heart Disease Father ??? Heart Attack Sister    ??? Diabetes Sister    ??? Heart Disease Sister    ??? Hypertension Sister    ??? Arthritis Mother    ??? Diabetes Mother    ??? Hypertension Mother    ??? Hearing Loss Mother    ??? Heart Disease Mother    ??? Osteoporosis Mother    ??? Alcohol abuse Brother    ??? Depression Brother    ??? Drug Abuse Brother    ??? Mental Illness Brother    ??? Arthritis Maternal Grandmother    ??? Hypertension Maternal Grandmother    ??? Stroke Maternal Grandfather    ??? Cancer Paternal Grandmother    ??? Early Death Paternal Grandmother    ??? Melanoma Neg Hx      Social History     Social History   ??? Marital status: Married     Spouse name: N/A   ??? Number of children: 4   ??? Years of education: N/A     Occupational History   ???  [employer]   ???  [employer]     Social History Main Topics   ??? Smoking status: Never Smoker   ??? Smokeless tobacco: Never Used   ??? Alcohol use No   ??? Drug use: No   ??? Sexual activity: Yes     Partners: Male     Other Topics Concern   ??? Not on file     Social History Narrative   ??? No narrative on file      Immunizations (includes history and patient reported):   Immunization History   Administered Date(s) Administered   ??? Flu Vaccine =>6 Months Quadrivalent PF 09/28/2016   ??? Hepatitis B Vaccine Adult 3 Dose IM 09/04/2014, 03/24/2015, 09/28/2016   ??? Pneumococcal Vaccine (23-Val Adult) 11/28/2013   ??? Tdap Vaccine 03/24/2015           Allergies:  Hazelnut; Levaquin [levofloxacin]; Lisinopril; and Nsaids (non-steroidal anti-inflammatory drug)    Medications:  Current Medications    Medication Directions   ascorbic acid (VITAMIN C) 500 mg tablet Take 500 mg by mouth daily.   aspirin  EC 81 mg tablet Take 1 tablet by mouth daily. Take with food.   atorvastatin (LIPITOR) 40 mg tablet Take 40 mg by mouth daily.   buPROPion SR(+) (WELLBUTRIN-SR) 150 mg tablet Take 1 tablet by mouth daily.   CALCIUM CITRATE-VITAMIN D3 PO Take 500 mg by mouth three times daily. carvedilol (COREG) 12.5 mg tablet Take 1 tablet by mouth twice daily with food.   diclofenac(+) (VOLTAREN) 1 % topical gel Apply 2 g topically to affected area three times daily.   ferrous sulfate 300 mg/5 mL oral solution Take 300 mg by mouth daily.   furosemide (LASIX) 20 mg tablet Take 1 tablet by mouth every morning.   hydroxychloroquine (PLAQUENIL) 200 mg tablet Take 1 tablet by mouth twice daily. Take with food.   Miscellaneous Medical Supply misc Please check your blood pressure twice a day   MULTIVITAMIN PO Take 1 Tab by mouth daily.   omeprazole DR(+) (PRILOSEC) 40 mg capsule Take 1 capsule by mouth daily before breakfast.   potassium chloride (K-DUR) 10 mEq tablet Take 1 tablet by mouth daily. Take with a meal and a full glass of water.   triamcinolone acetonide (KENALOG) 0.1 % topical ointment Apply  topically to affected area twice daily.       Review of Systems:  A 10 systems review of systems was negative except as noted in HPI above    Physical Exam:  Vital Signs: Last Filed In 24 Hours Vital Signs: 24 Hour Range   BP: 175/84 (07/23 1830)  Temp: 36.6 ???C (97.8 ???F) (07/23 1352)  Pulse: 59 (07/23 1830)  Respirations: 12 PER MINUTE (07/23 1830)  SpO2: 100 % (07/23 1830)  O2 Delivery: None (Room Air) (07/23 1552)  SpO2 Pulse: 59 (07/23 1830)  Height: 162.6 cm (64) (07/23 1352) BP: (154-187)/(71-93)   Temp:  [36.6 ???C (97.8 ???F)]   Pulse:  [59-63]   Respirations:  [0 PER MINUTE-16 PER MINUTE]   SpO2:  [98 %-100 %]   O2 Delivery: None (Room Air)            Physical Exam  - General: No acute distress. Well appearing. Overweight.   - Eyes: PEERL. EOMI. No scleral icterus or injection. Eyelids symmetric without ptosis.   - HEENT: Atraumatic, normocephalic. Oropharynx clear; normal hard and sort palate. Mucous membranes moist without ulceration.   - Neck: Supple. Symmetric. Full ROM.   - CV: S1, S2 normal with normal rate without rub, gallop or murmur. Distal pulses intact. Trace bilateral LE edema. No palpable abnormality of blood vessels in LE bilaterally. No calf tenderness.   - Resp: CTA bilat. with normal respiratory effort. Symmetric expansion upon inspiration.   - GI: Soft, non-tender, non-distended without mass or guarding. Normal bowel sounds.   - Skin: Warm, moist, normal turgor. Cap refill < 3 sec. No rash appreciated on limited inspection.   - Neuro: Alert and grossly oriented with stable level of consciousness. Good tone. No gross focal deficits appreciated.   - Psych: Dressed in hospital clothes. Appropriately groomed. Calm and cooperative. No psychomotor retardation or agitation. Makes appropriate eye contact. Speech with normal rate, cadence, and volume, without pressure. Affect euthymic, appropriate to the situation. Linear, logical, and goal directed. No delusions elicited. Cognition grossly intact. Good judgment/insight.       Lab/Radiology/Other Diagnostic Tests:  24-hour labs:    Results for orders placed or performed during the hospital encounter of 07/18/17 (from the past 24 hour(s))   CBC AND DIFF  Collection Time: 07/18/17  4:00 PM   Result Value Ref Range    White Blood Cells 9.6 4.5 - 11.0 K/UL    RBC 4.25 4.0 - 5.0 M/UL    Hemoglobin 12.4 12.0 - 15.0 GM/DL    Hematocrit 74.2 36 - 45 %    MCV 87.8 80 - 100 FL    MCH 29.3 26 - 34 PG    MCHC 33.3 32.0 - 36.0 G/DL    RDW 59.5 11 - 15 %    Platelet Count 315 150 - 400 K/UL    MPV 8.4 7 - 11 FL    Neutrophils 47 41 - 77 %    Lymphocytes 45 (H) 24 - 44 %    Monocytes 5 4 - 12 %    Eosinophils 2 0 - 5 %    Basophils 1 0 - 2 %    Absolute Neutrophil Count 4.50 1.8 - 7.0 K/UL    Absolute Lymph Count 4.30 1.0 - 4.8 K/UL    Absolute Monocyte Count 0.50 0 - 0.80 K/UL    Absolute Eosinophil Count 0.20 0 - 0.45 K/UL    Absolute Basophil Count 0.00 0 - 0.20 K/UL   COMPREHENSIVE METABOLIC PANEL    Collection Time: 07/18/17  4:00 PM   Result Value Ref Range    Sodium 139 137 - 147 MMOL/L Potassium 3.4 (L) 3.5 - 5.1 MMOL/L    Chloride 104 98 - 110 MMOL/L    Glucose 88 70 - 100 MG/DL    Blood Urea Nitrogen 14 7 - 25 MG/DL    Creatinine 6.38 0.4 - 1.00 MG/DL    Calcium 75.6 8.5 - 43.3 MG/DL    Total Protein 8.0 6.0 - 8.0 G/DL    Total Bilirubin 0.5 0.3 - 1.2 MG/DL    Albumin 4.4 3.5 - 5.0 G/DL    Alk Phosphatase 81 25 - 110 U/L    AST (SGOT) 24 7 - 40 U/L    CO2 28 21 - 30 MMOL/L    ALT (SGPT) 26 7 - 56 U/L    Anion Gap 7 3 - 12    eGFR Non African American >60 >60 mL/min    eGFR African American >60 >60 mL/min   PROTIME INR (PT)    Collection Time: 07/18/17  4:00 PM   Result Value Ref Range    INR 1.0 0.8 - 1.2   PTT (APTT)    Collection Time: 07/18/17  4:00 PM   Result Value Ref Range    APTT 33.1 21.0 - 39.0 SEC   D-DIMER    Collection Time: 07/18/17  4:00 PM   Result Value Ref Range    D-Dimer 535 (H) <500 ng/mL FEU   LIPASE    Collection Time: 07/18/17  4:00 PM   Result Value Ref Range    Lipase 16 11 - 82 U/L   POC TROPONIN    Collection Time: 07/18/17  4:03 PM   Result Value Ref Range    Troponin-I-POC 0.01 0.00 - 0.05 NG/ML   POC TROPONIN    Collection Time: 07/18/17  6:58 PM   Result Value Ref Range    Troponin-I-POC 0.00 0.00 - 0.05 NG/ML     Glucose: 88 (07/18/17 1600)  Urine Pregnancy: (S) Negative (07/18/17 1820)      CHEST SINGLE VIEW   Final Result          Microbiology  Resulted Micro Last 72 Hrs    No results found  Pathology  Surgical Pathology 04/21/2017  A. Left thigh:   -- Small vessel vasculitis     B. Left thigh-DIF:   -- 2+ IgA vascular deposits   -- Trace IgM/C3 vascular deposits   -- Negative for IgG deposits.

## 2017-07-19 NOTE — Case Management (ED)
Case Management Admission Assessment    NAME:Sara Mahoney                          MRN: [id number]             DOB:15-Jan-1961          AGE: 56 y.o.  ADMISSION DATE: 07/18/2017             DAYS ADMITTED: LOS: 1 day      Today???s Date: 07/19/2017    Source of Information: Patient       Plan  Plan: CM Assessment, Discharge Planning for Home Anticipated, Assist PRN with SW/NCM Services   Anticipate DC home with family support.  Cardiology to arrange for event monitor.  Family provide transportation home.    Interventions  ? Support      ? Info or Referral      ? Discharge Planning      ? Medication Needs   Medication Needs: Other (Assess insurance overage for Zio Patch)   NCM attempt to assess benefits and copay for Zio Patch monitor.  NCM contact [employer]. Rosann Auerbach states that CTP code is not current. NCM request additional assistance from Cardiology team. Plan DC home with event monitor.    ? Financial      ? Legal      ? Other        Disposition  ? Expected Discharge Date    Expected Discharge Date: 07/19/17  ? Transportation   Does the patient need discharge transport arranged?: No  Transportation Name, Phone and Availability #1: Family at bedside  Does the patient use Medicaid Transportation?: No  ? Next Level of Care (Acute Psych discharges only)      ? Discharge Disposition                                          Durable Medical Equipment     No service has been selected for the patient.      [facility]     No service has been selected for the patient.      [facility]     No service has been selected for the patient.      [facility]     No service has been selected for the patient.              Patient Address/Phone  [address]  [place] [address]  [phone] (home) [phone] (work)    Emergency Contact  Extended Emergency Contact Information  Primary Emergency Contact: Heloise Beecham  Address: [address]           [place], [place] [address] [place] Phone: [phone]  Relation: Spouse  Secondary Emergency Contact: Luiz [place]are [place]  Home Phone: [phone]  Relation: Daughter    Network engineer Directive: No, patient does not have a healthcare directive  Would patient like to fill out a (a new) Healthcare Directive?: No, patient declined  Psych Advance Directive (Psych unit only): No, patient does not have a Social research officer, government  Does the patient need discharge transport arranged?: No  Transportation Name, Phone and Availability #1: Family at bedside  Does the patient use Medicaid Transportation?: No  Expected Discharge Date  Expected Discharge Date: 07/19/17    Living Situation Prior to Admission  ? Living Arrangements  Type of Residence: Home, independent  Living Arrangements: Spouse/significant other  How many levels in the residence?: 1  Can patient live on one level if needed?: Yes  Does residence have entry and/or side stairs?: Yes (5 steps to enter basement apartment)  Assistance needed prior to admit or anticipated on discharge: No  ? Level of Function   Prior level of function: Independent  ? Cognitive Abilities   Cognitive Abilities: Alert and Oriented, Participates in decision making, Engages in problem solving and planning    Financial Resources  ? Coverage  Primary Insurance: Designer, jewellery)  Additional Coverage: RX (Fills at [facility] [employer] [facility] [address])    ? 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 Financial Assistance Needed?  no    Psychosocial Needs  ? Mental Health  Mental Health History: No  ? Substance Use History     ? Other  none    Current/Previous Services  ? PCP  Asham, Mirna, None, None    Dr. Kyung Rudd [facility]    ? Pharmacy    [facility] [address], [place] - [place], [place] - [address]  [address]  [place] [address] Phone: [phone] Fax: [phone]    [facility] - [place], [place] - [address]  [address] [place] [address]  Phone: [phone] Fax: [phone]    ? Durable Medical Equipment   Durable Medical Equipment at home: None  ? Home Health  Receiving home health: No  ? Hemodialysis or Peritoneal Dialysis  Undergoing hemodialysis or peritoneal dialysis: No  ? Tube/Enteral Feeds  Receive tube/enteral feeds: No  ? Infusion  Receive infusions: No  ? Private Duty  Private duty help used: No  ? Home and Community Based Services  Home and community based services: No  ? [employer]: N/A  ? Hospice  Hospice: No  ? Outpatient Therapy  PT: No  OT: No  SLP: No  ? Skilled Nursing Facility/Nursing Home  SNF: No  NH: No  ? Inpatient Rehab  IPR: No  ? Long-Term Acute Care Hospital  LTACH: No  ? Acute Hospital Stay  Acute Hospital Stay: In the past  Was patient's stay within the last 30 days?: No  When did patient receive care?: 10/2016 r/t bariatric surgery  Name of hospital: [facility]    Ellamae Sia RN BSN  Integrated Nurse Case Manager  [phone]/ [phone]

## 2017-07-20 ENCOUNTER — Encounter
Admit: 2017-07-20 | Discharge: 2017-07-20 | Payer: Private Health Insurance - Indemnity | Primary: Student in an Organized Health Care Education/Training Program

## 2017-07-20 NOTE — Telephone Encounter
Becky Banker(RN) with [facility] states they received an 18 page fax from Estil DaftJoel Hake to Dr. Neale BurlyFreeman. States they do not have the pt. in their system and was inquiring if they need to schedule an appointment for the pt. Please call [phone]. LVM [id number]

## 2017-07-20 NOTE — Telephone Encounter
Hospital Discharge Follow Up      Reached Patient:Yes     Admission Information:     Hospital Name: [facility]  Admission Date: 07/18/2017  Discharge Date: 07/19/2017    Discharge Diagnosis: (Principal)Chest pain; Coronary artery disease involving native coronary artery of native heart without angina pectoris; Diabetes mellitus, type II (HCC); Essential hypertension with goal blood pressure less than 140/90; Overweight; Anxiety; Atrial fibrillation; Depression; Hyperlipidemia  Was this a readmission? No  If yes, reason: N/A  Hospital Services: Unplanned  Today's call is 1(business) days post discharge      Discharge Instruction Review   Did patient receive and understand discharge instructions? Yes    Home Health ordered? No                 Agency name/telephone number: N/A   Has HH agency contacted patient? N/A   Caregiver assistance in the home? has spouse to help as needed.   Are there concerns regarding the patient's ADL'S? No  Is patient a fall risk? No    Special diet? Yes If yes, type: cardiac diet      Medication Reconciliation    Changes to pre-hospital medications? No  Were new prescriptions filled?No,  due to will pick them up today, 07/20/2017  START taking:  losartan 25 mg tablet (COZAAR)    Understanding Condition   Having any current symptoms? No Denies chest pain, pain anywhere, fever, nausea, vomiting, shortness of breath.   Patient understands when to seek additional medical care? Yes   Other instructions provided :       Scheduling Follow-up Appointment   Upcoming appointment date and time and with whom scheduled: Future Appointments  Date Time Provider Department Center   07/25/2017 10:15 AM Denzil Magnuson, MD [facility]   09/20/2017 4:00 PM Fernanda Drum, MD [facility] IM   12/01/2017 4:00 PM Duane Lope, MD [facility]     PCP appointment scheduled?Yes, Date: 07/25/2017   PCP primary location: [facility] Specialist appointment scheduled? Yes, with Rheumatology 09/20/2017  Both PCP and Specialist appointment scheduled: Yes  Is assistance with transportation needed?No

## 2017-07-20 NOTE — Progress Notes
Brand: CN (Cardionet or Cardiolab)    Type: Looping (Looping, Non-looping, etc.)    Length: 14 Days    Ordering Dr: Kate SableWoodward    Diagnosis: Chest Pain    Was insurance info faxed with enrollment: Yes

## 2017-07-20 NOTE — Telephone Encounter
Phone call to Berenice PrimasBecky, [facility], states the patient is not their patient but she [facility] so she shredded the document.     Routing to Dr. Ike BeneHake for clarification.

## 2017-07-20 NOTE — Telephone Encounter
-----   Message from Ginette OttoLaurie Christien Berthelot, RN sent at 07/20/2017 10:49 AM CDT -----  Regarding: FW: f/u appointment  HI Dr Benedict NeedyNath,    I didn't hear back from AmazoniaBarbara and it is not specified in DC note about to schedule f/u with you.    When would you like to see her back?     Thank you!!  ----- Message -----  From: Ginette OttoNelson, Ferris Fielden, RN  Sent: 07/19/2017   5:02 PM  To: Levonne HubertBarbara Thompson, APRN, Ginette OttoLaurie Cordae Mccarey, RN  Subject: f/u appointment                                  How soon do you want her to be seen?  I am covering for Olegario MessierKathy and will enter order for f/u.    Thank you  ----- Message -----  From: Levonne Huberthompson, Barbara, APRN  Sent: 07/19/2017   4:26 PM  To: Lenice LlamasJayant Nath, MD, Carlyn ReichertKathy King, RN  Subject: Event monitor                                    Mrs. Verner Mouldastwood was hospitalized overnight by medicine team for CP. Echo and Stress test were normal. I ordered a 14 day event monitor for her as she complained of "fluttering" palpitations. She did have documented bigeminy which may have been what she was feeling. Thought we'd check for sure and maybe see about PVC burden.    Can you please arrange outpatient follow up. I didn't get it requested in O2 discharge instructions.    Thank you!  Levonne HubertBarbara Thompson

## 2017-07-21 NOTE — Telephone Encounter
Called and discussed with Ms. Sara Mahoney that we will notify her after Dr Benedict NeedyNath has reviewed and advises about an appointment time. She verbalized understanding. She is awaiting the Event monitor. It was ordered on 07/19/17.  Informed Ms. Sara Mahoney to call on Monday if she still has not received. LN

## 2017-07-21 NOTE — Discharge Instructions - Pharmacy
Physician Discharge Summary      Name: MARQUISIA FRAVEL  Medical Record Number: [id number]        Account Number:  [id number]  Date Of Birth:  1961-09-16                         Age:  56 years   Admit date:  07/18/2017                     Discharge date:  07/19/2017    Attending Physician:  Dr. Kate Sable             Service: [facility] Medicine    Physician Summary completed by: Oda Cogan, APRN-NP    Reason for hospitalization: Chest pain    Significant PMH:   Past Medical History:   Diagnosis Date   ??? Allergy    ??? Anxiety disorder    ??? CAD (coronary artery disease) 12/24/2008   ??? Depression 12/24/2008   ??? Diabetes mellitus type II, uncontrolled (HCC) 02/07/2008   ??? GERD (gastroesophageal reflux disease)    ??? History of blood transfusion    ??? HLD (hyperlipidemia) 12/24/2008   ??? HX Wrist fracture (right)    ??? Hypertension 02/07/2008   ??? Hypertensive heart disease    ??? Neuromuscular disorder (HCC)    ??? Small vessel vasculitis (HCC)           Allergies: Hazelnut; Levaquin [levofloxacin]; Lisinopril; and Nsaids (non-steroidal anti-inflammatory drug)    Admission Physical Exam notable for:      Vital Signs: Last Filed In 24 Hours Vital Signs: 24 Hour Range   BP: 175/84 (07/23 1830)  Temp: 36.6 ???C (97.8 ???F) (07/23 1352)  Pulse: 59 (07/23 1830)  Respirations: 12 PER MINUTE (07/23 1830)  SpO2: 100 % (07/23 1830)  O2 Delivery: None (Room Air) (07/23 1552)  SpO2 Pulse: 59 (07/23 1830)  Height: 162.6 cm (64) (07/23 1352) BP: (154-187)/(71-93)   Temp:  [36.6 ???C (97.8 ???F)]   Pulse:  [59-63]   Respirations:  [0 PER MINUTE-16 PER MINUTE]   SpO2:  [98 %-100 %]   O2 Delivery: None (Room Air)    ???   ???  ???  Physical Exam  - General: No acute distress. Well appearing. Overweight.   - Eyes: PEERL. EOMI. No scleral icterus or injection. Eyelids symmetric without ptosis.   - HEENT: Atraumatic, normocephalic. Oropharynx clear; normal hard and sort palate. Mucous membranes moist without ulceration.   - Neck: Supple. Symmetric. Full ROM. - CV: S1, S2 normal with normal rate without rub, gallop or murmur. Distal pulses intact. Trace bilateral LE edema. No palpable abnormality of blood vessels in LE bilaterally. No calf tenderness.   - Resp: CTA bilat. with normal respiratory effort. Symmetric expansion upon inspiration.   - GI: Soft, non-tender, non-distended without mass or guarding. Normal bowel sounds.   - Skin: Warm, moist, normal turgor. Cap refill < 3 sec. No rash appreciated on limited inspection.   - Neuro: Alert and grossly oriented with stable level of consciousness. Good tone. No gross focal deficits appreciated.   - Psych: Dressed in hospital clothes. Appropriately groomed. Calm and cooperative. No psychomotor retardation or agitation. Makes appropriate eye contact. Speech with normal rate, cadence, and volume, without pressure. Affect euthymic, appropriate to the situation. Linear, logical, and goal directed. No delusions elicited. Cognition grossly intact. Good judgment/insight.       Admission Lab/Radiology studies notable for:   - troponin  negative ???4 at 1603, 1858, 0126, 0650    - Chest x-ray without acute cardiopulmonary abnormality    - EKG with normal sinus rhythm. ???Auto interpreted as ventricular bigeminy, but believe this only represents PVCs. ???Previously observed right bundle branch block. ???HR 57. ???QTc 455. ???No STEMI.    - CTA Chest: No evidence of acute pulmonary embolism, no consolidating pneumonia or pleural effusion. Prior median sternotomy and CABG with dense native coronary artery calcification. Prior gastric bypass with small hiatal hernia.    - Bilateral LE venous doppler: No evidence of acute femoral/popliteal DVT in the bilateral lower extremities    - MPI stress test: This study is normal.  There is no evidence of significant active inducible ischemia or myocardial injury.  No negative prognostic indicators are present.  All myocardial segments are viable.  A prior study had been performed on 05/09/2015.  Those images could not be retrieved for comparison.  However the report of the prior study suggested it was felt to be normal without evidence of significant inducible ischemia.    - Echocardiogram: Normal left ventricular function, EF 55% - normal wall motion. Normal chamber dimensions. Nodular thickening on the aortic vale with an estimated peak PAP ~ 21 mm hg. No pericardial effusion.       Results for SHAWANDA, SIEVERT (MRN [id number]) as of 07/21/2017 13:28   Ref. Range 07/19/2017 04:41   Hemoglobin Latest Ref Range: 12.0 - 15.0 GM/DL 30.8 (L)   Hematocrit Latest Ref Range: 36 - 45 % 33.5 (L)   Platelet Count Latest Ref Range: 150 - 400 K/UL 268   White Blood Cells Latest Ref Range: 4.5 - 11.0 K/UL 8.5   Neutrophils Latest Ref Range: 41 - 77 % 48   Absolute Neutrophil Count Latest Ref Range: 1.8 - 7.0 K/UL 4.20   Lymphocytes Latest Ref Range: 24 - 44 % 42   Absolute Lymph Count Latest Ref Range: 1.0 - 4.8 K/UL 3.60   Monocytes Latest Ref Range: 4 - 12 % 6   Absolute Monocyte Count Latest Ref Range: 0 - 0.80 K/UL 0.50   Eosinophils Latest Ref Range: 0 - 5 % 3   Absolute Eosinophil Count Latest Ref Range: 0 - 0.45 K/UL 0.20   Absolute Basophil Count Latest Ref Range: 0 - 0.20 K/UL 0.10   Basophils Latest Ref Range: 0 - 2 % 1   RBC Latest Ref Range: 4.0 - 5.0 M/UL 3.79 (L)   MCV Latest Ref Range: 80 - 100 FL 88.3   MCH Latest Ref Range: 26 - 34 PG 29.3   MCHC Latest Ref Range: 32.0 - 36.0 G/DL 65.7   MPV Latest Ref Range: 7 - 11 FL 8.2   RDW Latest Ref Range: 11 - 15 % 13.9   Sodium Latest Ref Range: 137 - 147 MMOL/L 139   Potassium Latest Ref Range: 3.5 - 5.1 MMOL/L 4.2   Chloride Latest Ref Range: 98 - 110 MMOL/L 107   CO2 Latest Ref Range: 21 - 30 MMOL/L 26   Anion Gap Latest Ref Range: 3 - 12  6   Blood Urea Nitrogen Latest Ref Range: 7 - 25 MG/DL 11   Creatinine Latest Ref Range: 0.4 - 1.00 MG/DL 8.46   eGFR Non African American Latest Ref Range: >60 mL/min >60 eGFR African American Latest Ref Range: >60 mL/min >60   Glucose Latest Ref Range: 70 - 100 MG/DL 82   Albumin Latest Ref Range: 3.5 - 5.0 G/DL 3.6  Calcium Latest Ref Range: 8.5 - 10.6 MG/DL 9.4   Magnesium Latest Ref Range: 1.6 - 2.6 mg/dL 2.2   Total Bilirubin Latest Ref Range: 0.3 - 1.2 MG/DL 0.4   Total Protein Latest Ref Range: 6.0 - 8.0 G/DL 6.4   Phosphorus Latest Ref Range: 2.0 - 4.0 MG/DL 4.1 (H)   AST (SGOT) Latest Ref Range: 7 - 40 U/L 25   ALT (SGPT) Latest Ref Range: 7 - 56 U/L 23   Alk Phosphatase Latest Ref Range: 25 - 110 U/L 61             Brief Hospital Course:  The patient was admitted and the following issues were addressed during this hospitalization: (with pertinent details).      SHANNETTA SEITZINGER is a 56 y.o. female with PMH/cormobidities including  CAD S/P CABG, atrial fibrillation, type 2 diabetes (A1c 5.5), obesity S/P gastric bypass currently overweight, hypertension, hyperlipidemia, ocular complications of diabetes, depression, anxiety, GERD, small vessel vasculitis, neuromuscular disorder.  ???  Presented to the emergency department 07/18/2017 with complaint of dyspnea, heartburn beginning 1030.  States exacerbated by drinking coffee containing espresso.  Associated with chest pressure, worsened by inspiration.  Recent history of gastric bypass 6 months previously.  Also recently stopped taking aspirin, but is been advised by cardiology to resume.  Last seen in cardiology clinic 03/16/2017.  Underwent CABG in 2009. No change in activities the past few days. Pain resolved by time she was interviewed in the ED.  Chest pain had cramping quality, moderate severity. Some exertional dyspnea associated.   Cardiology was consulted, troponins and MPI stress test negative as above. She was ordered a 14 day event monitor to evaluate her palpitations. During admission she was sinus rhythm with PACs and PVCs but has a history of afib in the postoperative period following her gastric bypass. During admission she was noted to be bradycardic with heart rates in the 50s, asymptomatic. She was also started on losartan 25mg  daily for hypertension.   ???  On 07/17/2017 she noticed LE pain with what she was concerned for was a palpable blood vessel, now resolved. Imaging negative for DVT/PE.      Condition at Discharge: Stable    Discharge Diagnoses:       Hospital Problems        Active Problems    Diabetes mellitus, type II (HCC)    Essential hypertension with goal blood pressure less than 140/90    Coronary artery disease involving native coronary artery of native heart without angina pectoris    HLD (hyperlipidemia)    Depression    Atrial fibrillation (HCC)    Overweight    Anxiety       Resolved Problems    * (Principal)RESOLVED: Chest pain          Surgical Procedures: None    Significant Diagnostic Studies and Procedures: noted in brief hospital course    Consults:  Cardiology    Patient Disposition: Home       Patient instructions/medications:     Activity as Tolerated   It is important to keep increasing your activity level after you leave the hospital.  Moving around can help prevent blood clots, lung infection (pneumonia) and other problems.  Gradually increasing the number of times you are up moving around will help you return to your normal activity level more quickly.  Continue to increase the number of times you are up to the chair and walking daily to return to your  normal activity level. Begin to work toward your normal activity level at discharge     Report These Signs and Symptoms   Please contact your doctor if you have any of the following symptoms: temperature higher than 100 degrees F, uncontrolled pain, persistent nausea and/or vomiting, difficulty breathing, chest pain, severe abdominal pain, headache, unable to urinate or unable to have bowel movement     Questions About Your Stay   For questions or concerns regarding your hospital stay. Call [phone] Discharging attending physician: Nyra Market [[id number]]      Cardiac Diet   Limiting unhealthy fats and cholesterol is the most important step you can take in reducing your risk for cardiovascular disease.  Unhealthy fats include saturated and trans fats.  Monitor your sodium and cholesterol intake.  Restrict your sodium to 2g (grams) or 2000mg  (milligrams) daily, and your cholesterol to 200mg  daily.    If you have questions regarding your diet at home, you may contact a dietitian at [phone].       EVENT MONITOR   Standing Status: Future  Standing Exp. Date: 07/19/18   14-day event monitor   Scheduling Priority: Routine         Current Discharge Medication List       START taking these medications    Details   losartan (COZAAR) 25 mg tablet Take 1 tablet by mouth daily.  Qty: 30 tablet, Refills: 1    PRESCRIPTION TYPE:  Normal          CONTINUE these medications which have NOT CHANGED    Details   ascorbic acid (VITAMIN C) 500 mg tablet Take 500 mg by mouth daily.    PRESCRIPTION TYPE:  Historical Med      aspirin EC 81 mg tablet Take 1 tablet by mouth daily. Take with food.  Qty: 90 tablet, Refills: 3    PRESCRIPTION TYPE:  No Print      atorvastatin (LIPITOR) 40 mg tablet Take 40 mg by mouth daily.    PRESCRIPTION TYPE:  Historical Med      buPROPion SR(+) (WELLBUTRIN-SR) 150 mg tablet Take 1 tablet by mouth daily.  Qty: 90 tablet, Refills: 2    PRESCRIPTION TYPE:  Normal      calcium carbonate/vitamin D-3 (OSCAL-500+D) 1250 mg/200 unit tablet Take 1 tablet by mouth three times daily with meals. Calcium Carb 1250mg  delivers 500mg  elemental Ca    PRESCRIPTION TYPE:  Historical Med      carvedilol (COREG) 12.5 mg tablet Take 1 tablet by mouth twice daily with food.  Qty: 180 tablet, Refills: 2    PRESCRIPTION TYPE:  Normal      ferrous sulfate (FEOSOL, FEROSUL) 325 mg (65 mg iron) tablet Take 325 mg by mouth daily. Take on an empty stomach at least 1 hour before or 2 hours after food. PRESCRIPTION TYPE:  Historical Med      hydroxychloroquine (PLAQUENIL) 200 mg tablet Take 1 tablet by mouth twice daily. Take with food.  Qty: 60 tablet, Refills: 3    PRESCRIPTION TYPE:  Normal      Miscellaneous Medical Supply misc Please check your blood pressure twice a day  Qty: 1 Each, Refills: 0    PRESCRIPTION TYPE:  Normal  Associated Diagnoses: Hypertension      multivit-min-iron-FA-lutein (CENTRUM SILVER WOMEN) 8 mg iron-400 mcg-300 mcg tab Take 1 tablet by mouth daily.    PRESCRIPTION TYPE:  Historical Med      polycarbophil (FIBER-CAPS (CA POLYCARBOPHIL)) 625 mg  tablet Take 625 mg by mouth daily.    PRESCRIPTION TYPE:  Historical Med      triamcinolone acetonide (KENALOG) 0.1 % topical ointment Apply  topically to affected area twice daily.  Qty: 80 g, Refills: 3    PRESCRIPTION TYPE:  Normal  Associated Diagnoses: Small vessel vasculitis (HCC)          The following medications were removed from your list. This list includes medications discontinued this stay and those removed from your prior med list in our system        CALCIUM CITRATE-VITAMIN D3 PO        cephalexin (KEFLEX) 500 mg capsule        diclofenac(+) (VOLTAREN) 1 % topical gel        ferrous sulfate 300 mg/5 mL oral solution        furosemide (LASIX) 20 mg tablet        MULTIVITAMIN PO        omeprazole DR(+) (PRILOSEC) 40 mg capsule        potassium chloride (K-DUR) 10 mEq tablet               Scheduled appointments:    Jul 28, 2017  3:30 PM CDT  Return Patient with Denzil Magnuson, MD  [facility]) [facility] [address]  [address]  [place] [address]  [phone]   Sep 20, 2017  4:00 PM CDT  Specialty Return with Fernanda Drum, MD  [facility]) [facility]  [address] [place] [address]  [phone]   Dec 01, 2017  4:00 PM CST  Return Patient with Duane Lope, MD [facility] Internal [facility]) [address]  [address]  [place] [address]  [phone]          Pending items needing follow up: Event monitor, BP on new regimen    Signed:  Oda Cogan, APRN-NP  07/21/2017      cc:  Primary Care Physician:  Duncan Dull   Verified  Referring physicians:  No ref. provider found   Additional provider(s):

## 2017-07-22 NOTE — Telephone Encounter
Female, 56 y.o., 24-May-1961  Weight:   74.8 kg (165 lb)  Phone:   [phone] [address]ie Petit(M)  PCP:   Duncan DullAsham, Mirna, MD  MRN:   [id number]  MyChart:   Active  Next Appt:   07/28/2017     RE: f/u appointment   Received: Today   Message Contents   Levonne Huberthompson, Barbara, APRN  Ginette OttoNelson, Addley Ballinger, RN        Sorry Georges MouseLaurie, I've been off the past 2 days.     I think a one month or so f/u would be fine. Long enough to have the results back on the event monitor.     Thank you!   Britta MccreedyBarbara   Previous Messages        ----- Message -----   From: Ginette OttoNelson, Lawson Mahone, RN   Sent: 07/19/2017  5:02 PM   To: Levonne HubertBarbara Thompson, APRN, Ginette OttoLaurie Jeyda Siebel, RN   Subject: f/u appointment                    How soon do you want her to be seen? I am covering for Olegario MessierKathy and will enter order for f/u.     Thank you

## 2017-07-22 NOTE — Telephone Encounter
Scheduled pt on 08/22/17 at [place] at 11:20 with Dr Benedict NeedyNath. She is aware of date/time and location. LN

## 2017-07-25 NOTE — Telephone Encounter
Lenice LlamasNath, Jayant, MD  Ginette OttoNelson, Crytal Pensinger, RN        Please make a follow up after event monitor- ~ 1 month. She will need clearance for sx as well, can do after event monitor.     Thanks

## 2017-07-28 ENCOUNTER — Encounter
Admit: 2017-07-28 | Discharge: 2017-07-28 | Payer: Private Health Insurance - Indemnity | Primary: Student in an Organized Health Care Education/Training Program

## 2017-07-28 ENCOUNTER — Ambulatory Visit
Admit: 2017-07-28 | Discharge: 2017-07-28 | Payer: Commercial Managed Care - HMO | Primary: Student in an Organized Health Care Education/Training Program

## 2017-07-28 DIAGNOSIS — S62109A Fracture of unspecified carpal bone, unspecified wrist, initial encounter for closed fracture: ICD-10-CM

## 2017-07-28 DIAGNOSIS — Z9289 Personal history of other medical treatment: ICD-10-CM

## 2017-07-28 DIAGNOSIS — E785 Hyperlipidemia, unspecified: ICD-10-CM

## 2017-07-28 DIAGNOSIS — G709 Myoneural disorder, unspecified: ICD-10-CM

## 2017-07-28 DIAGNOSIS — I10 Essential (primary) hypertension: ICD-10-CM

## 2017-07-28 DIAGNOSIS — F329 Major depressive disorder, single episode, unspecified: ICD-10-CM

## 2017-07-28 DIAGNOSIS — Z01818 Encounter for other preprocedural examination: Principal | ICD-10-CM

## 2017-07-28 DIAGNOSIS — T7840XA Allergy, unspecified, initial encounter: ICD-10-CM

## 2017-07-28 DIAGNOSIS — I776 Arteritis, unspecified: ICD-10-CM

## 2017-07-28 DIAGNOSIS — F419 Anxiety disorder, unspecified: ICD-10-CM

## 2017-07-28 DIAGNOSIS — E1165 Type 2 diabetes mellitus with hyperglycemia: ICD-10-CM

## 2017-07-28 DIAGNOSIS — Z09 Encounter for follow-up examination after completed treatment for conditions other than malignant neoplasm: Principal | ICD-10-CM

## 2017-07-28 DIAGNOSIS — K219 Gastro-esophageal reflux disease without esophagitis: ICD-10-CM

## 2017-07-28 DIAGNOSIS — I2510 Atherosclerotic heart disease of native coronary artery without angina pectoris: ICD-10-CM

## 2017-07-28 LAB — BASIC METABOLIC PANEL
Lab: 0.5 mg/dL (ref 0.4–1.00)
Lab: 105 MMOL/L (ref 98–110)
Lab: 138 MMOL/L — ABNORMAL LOW (ref 137–147)
Lab: 22 mg/dL (ref 7–25)
Lab: 29 MMOL/L (ref 21–30)
Lab: 3.8 MMOL/L — ABNORMAL LOW (ref 3.5–5.1)
Lab: 4 g/dL (ref 3–12)
Lab: 9.8 mg/dL (ref 8.5–10.6)
Lab: 96 mg/dL (ref 70–100)
Lab: >60 mL/min (ref >60)
Lab: >60 mL/min (ref >60)

## 2017-07-28 LAB — CBC
Lab: 3.5 M/UL — ABNORMAL LOW (ref 4.0–5.0)
Lab: 9 10*3/uL (ref 4.5–11.0)

## 2017-07-28 NOTE — Progress Notes
Date of Service: 07/28/2017    Subjective:             Sara Mahoney is a 56 y.o. female.    History of Present Illness  Sara Mahoney presents to clinic for hospital follow up.  She was admitted from 7/23-24 for chest pains.  She states she attempted to mix an espresso infused protein powder with her morning coffee which caused her chest pains and palpitations.  She had negative cardiac enzymes and MPI stress testing while inpatient.  She was started on Losartan for HTN found during admission.  Please see the discharge summary by Manfred Arch dated on 7/24 for more details.    Patient states she has been asymptomatic since hospital discharge.  She has been wearing a holter monitor since the hospitalization.  She denies any symptoms of chest pains, palpitations, or SOA.  She states she has already removed the espresso protein power from her diet and avoided coffee.  She states she's noticed occasional episodes of lightheadedness after taking her losartan that resolve quickly.  She denies any LOC or falls.  She states her BP has been under control at home.    Patient would also like Pre-Op clearance for torn achillies tendon repair.  She states she has been dealing with L heel pain for the past year and a half.  She states she was found to have a torn achillies tendon on the L side by MRI.  She is scheduled for surgery on 08/17/17 for repair.    Past Medical History:   Diagnosis Date   ??? Allergy    ??? Anxiety disorder    ??? CAD (coronary artery disease) 12/24/2008   ??? Depression 12/24/2008   ??? Diabetes mellitus type II, uncontrolled (HCC) 02/07/2008   ??? GERD (gastroesophageal reflux disease)    ??? History of blood transfusion    ??? HLD (hyperlipidemia) 12/24/2008   ??? HX Wrist fracture (right)    ??? Hypertension 02/07/2008   ??? Hypertensive heart disease    ??? Neuromuscular disorder (HCC)    ??? Small vessel vasculitis (HCC)      Social History     Social History   ??? Marital status: Married     Spouse name: N/A ??? Number of children: 4   ??? Years of education: N/A     Occupational History   ???  [employer]   ???  [employer]     Social History Main Topics   ??? Smoking status: Never Smoker   ??? Smokeless tobacco: Never Used   ??? Alcohol use No   ??? Drug use: No   ??? Sexual activity: Yes     Partners: Male     Other Topics Concern   ??? Not on file     Social History Narrative   ??? No narrative on file          Review of Systems   Constitutional: Negative for chills, fatigue and fever.   Eyes: Negative for pain and visual disturbance.   Respiratory: Negative for cough and shortness of breath.    Cardiovascular: Negative for chest pain and palpitations.   Gastrointestinal: Negative for abdominal pain, diarrhea, nausea and vomiting.   Musculoskeletal: Positive for arthralgias and gait problem. Negative for myalgias.   Neurological: Negative for dizziness, weakness, light-headedness, numbness and headaches.         Objective:         ??? aflibercept(+) (EYLEA) 2 mg/0.05 mL intravitreal injection    ???  ascorbic acid (VITAMIN C) 500 mg tablet Take 500 mg by mouth daily.   ??? aspirin EC 81 mg tablet Take 1 tablet by mouth daily. Take with food. (Patient taking differently: Take 81 mg by mouth at bedtime daily. Take with food.)   ??? atorvastatin (LIPITOR) 40 mg tablet Take 40 mg by mouth daily.   ??? buPROPion SR(+) (WELLBUTRIN-SR) 150 mg tablet Take 1 tablet by mouth daily.   ??? calcium carbonate/vitamin D-3 (OSCAL-500+D) 1250 mg/200 unit tablet Take 1 tablet by mouth three times daily with meals. Calcium Carb 1250mg  delivers 500mg  elemental Ca   ??? carvedilol (COREG) 12.5 mg tablet Take 1 tablet by mouth twice daily with food.   ??? ferrous sulfate (FEOSOL, FEROSUL) 325 mg (65 mg iron) tablet Take 325 mg by mouth daily. Take on an empty stomach at least 1 hour before or 2 hours after food.   ??? losartan (COZAAR) 25 mg tablet Take 1 tablet by mouth daily.   ??? Miscellaneous Medical Supply misc Please check your blood pressure twice a day ??? multivit-min-iron-FA-lutein (CENTRUM SILVER WOMEN) 8 mg iron-400 mcg-300 mcg tab Take 1 tablet by mouth daily.   ??? polycarbophil (FIBER-CAPS (CA POLYCARBOPHIL)) 625 mg tablet Take 625 mg by mouth daily.   ??? wheat dextrin (BENEFIBER SUGAR FREE (DEXTRIN)) 3 gram/3.8 gram powd      Vitals:    07/28/17 1518   BP: 113/68   Pulse: 66   Resp: 16   SpO2: 100%   Weight: 75.3 kg (166 lb)   Height: 164.5 cm (64.75)     Body mass index is 27.84 kg/m???.     Physical Exam   Constitutional: She is oriented to person, place, and time. She appears well-developed and well-nourished. No distress.   HENT:   Head: Normocephalic and atraumatic.   Mouth/Throat: Oropharynx is clear and moist.   Eyes: Conjunctivae and EOM are normal.   Neck: Normal range of motion. Neck supple. No thyromegaly present.   Cardiovascular: Normal rate, regular rhythm, normal heart sounds and intact distal pulses.  Exam reveals no gallop and no friction rub.    No murmur heard.  Halter monitor in place   Pulmonary/Chest: Effort normal and breath sounds normal. No respiratory distress. She exhibits no tenderness.   Lymphadenopathy:     She has no cervical adenopathy.   Neurological: She is alert and oriented to person, place, and time. No cranial nerve deficit.   Skin: Skin is warm and dry. No rash noted. She is not diaphoretic. No erythema.   Psychiatric: She has a normal mood and affect. Her behavior is normal.   Vitals reviewed.       Assessment and Plan:    Sara Mahoney was seen today for pre-op clearance.    Diagnoses and all orders for this visit:    Hospital discharge follow-up  - Admission from 7/23 to 7/24 for chest pains.  Trop-I and MPI stress negative.  D/C'ed with losartan and restarting ASA  - No acute concerns today, halter monitor in place, plans for 14 day monitor  - Lightheaded episodes may be due to Losartan, BP wnl in clinic today.  Recommend monitoring BP at home and discussing with Cards.  If concerns for hypotension, may consider decreasing coreg to 6.25 BID.    Pre-operative clearance  - Pre-op for Achilles tendon repair on 8/22  - Paperwork in clinic reviewed: recommended ECK, CBC, and BMP  - Will order CBC and BMP today  - No EKG  today due to halter monitor. Instructed patient to discuss EKG with Cardiology next week once halter monitor is removed.  Pre-op clearance pending Cardiology evaluation and clearance  -     CBC; Future; Expected date: 07/28/2017  -     BASIC METABOLIC PANEL; Future; Expected date: 07/28/2017    Patient discussed with Dr. Jaquita Rector, MD  Family Medicine, PGY-3  Pager *[phone]

## 2017-07-29 ENCOUNTER — Encounter
Admit: 2017-07-29 | Discharge: 2017-07-29 | Payer: Private Health Insurance - Indemnity | Primary: Student in an Organized Health Care Education/Training Program

## 2017-07-29 DIAGNOSIS — D649 Anemia, unspecified: Principal | ICD-10-CM

## 2017-07-29 NOTE — Progress Notes
[employer]  Baseline transmission received on 07/23/17.

## 2017-07-29 NOTE — Progress Notes
Patient notified of results via MyChart  BMP wnl.  CBC concerning for acute anemia.  Normal 11 days prior during hospitalization.  No concerns for active bleeding noted on previous OV.  Recommend repeating in 1 month if patient denies any gross bleeding.

## 2017-08-01 NOTE — Progress Notes
I have reviewed the case with Dr. Majid at the time of the visit and agree with the evaluation and plan as documented by the resident.

## 2017-08-10 ENCOUNTER — Ambulatory Visit
Admit: 2017-08-10 | Discharge: 2017-08-11 | Payer: Commercial Managed Care - HMO | Primary: Student in an Organized Health Care Education/Training Program

## 2017-08-11 DIAGNOSIS — R079 Chest pain, unspecified: Principal | ICD-10-CM

## 2017-08-19 ENCOUNTER — Encounter
Admit: 2017-08-19 | Discharge: 2017-08-19 | Payer: Private Health Insurance - Indemnity | Primary: Student in an Organized Health Care Education/Training Program

## 2017-08-19 MED ORDER — ATORVASTATIN 40 MG PO TAB
ORAL_TABLET | Freq: Every day | 3 refills | Status: AC
Start: 2017-08-19 — End: 2017-09-13

## 2017-08-19 MED ORDER — POTASSIUM CHLORIDE 10 MEQ PO TBER
ORAL_CAPSULE | Freq: Every day | 1 refills
Start: 2017-08-19 — End: ?

## 2017-08-19 NOTE — Telephone Encounter
Refill sent to pharmacy, via escribe per protocol.

## 2017-08-22 ENCOUNTER — Ambulatory Visit
Admit: 2017-08-22 | Discharge: 2017-08-23 | Payer: Commercial Managed Care - HMO | Primary: Student in an Organized Health Care Education/Training Program

## 2017-08-22 DIAGNOSIS — E782 Mixed hyperlipidemia: Secondary | ICD-10-CM

## 2017-08-22 NOTE — Progress Notes
Date of Service: 08/22/2017    Sara Mahoney is a 56 y.o. female.       HPI  56 year old female with a history of morbid obesity status post gastric bypass surgery, atrial fibrillation, and coronary artery disease status post coronary artery bypass graft in 2009, diabetes mellitus who presented to our cardiology clinic for routine follow-up visit.    Patient was recently admitted to hospital with chief complaint of chest pain in July 2018.  Patient underwent nuclear stress test on 07/19/2017 which was negative for any inducible ischemia.  2D echocardiogram done during the same visit on 07/19/2017 showed Normal left ventricular function, EF 55% - normal wall motion, Normal chamber dimensions, Nodular thickening on the aortic valve without stenosis or insufficiency, Normal diastolic function with an estimated peak PAP ~ 21mm hg. Patient was discharged to home.  Since discharge patient has been relatively doing well.     After that last week, patient underwent surgery for her torn Achilles tendon on left leg.  Patient underwent surgery without any complications.  Patient has been participating in physical therapy.  Patient does not have any complaints of chest pain, shortness of breath, dizziness, palpitation, headache, lightheadedness or any other symptoms.     EKG demonstrates sinus rhythm with a right bundle branch block.    Assessment and plan:  1. Coronary artery disease status post coronary artery bypass graft surgery:  Patient does not have any symptoms suggestive of angina such as coronary artery disease, shortness of breath on exertion.  Patient is relatively stable from her coronary artery disease standpoint.  We advised patient to continue taking aspirin 81 mg, atorvastatin 40 mg, carvedilol 12.5 mg twice daily, losartan 25 mg daily.  She will continue to control her risk factors such as hypertension, hyperlipidemia with optimal medical management. 2. Hypertension:  Patient blood pressure today in clinic is 106/72 mmHg which is very well controlled with current therapy. We will continue carvedilol and losartan at same dose.  3. Hypercholesterolemia: We will continue Lipitor 40 mg at bedtime daily.  We will recheck lipid panel and adjust the dose as per results.  Patient has recent LFTs done in hospital which were within normal limits.  4. Premature atrial contractions : Patient underwent Holter monitoring after discharge.  Patient was found to have occasional premature atrial contractions.  Patient is asymptomatic with them.  As patient has occasional PACs and currently patient is on carvedilol 12.5 mg twice daily.  We will continue with same medication.    It was a pleasure taking care of Sara Mahoney. Please feel free to contact us if you have any questions.  Pt seen, examined and discussed with Dr. Benedict Needy.  RTC in 1 year.    Karl Ito MBBS, MD.  Cardiology Fellow  [phone]      Staff:    I have personally interviewed and examined the patient, have reviewed the documentation, and agree with the assessment and plan of the CV Fellow.    Vitals:    08/22/17 1108   BP: 106/72   Pulse: 71   Weight: 71 kg (156 lb 8.4 oz)   Height: 1.626 m (5' 4)     Body mass index is 26.87 kg/m???.     Past Medical History  Patient Active Problem List    Diagnosis Date Noted   ??? PAC (premature atrial contraction) 08/22/2017   ??? Diabetic foot infection (HCC) 04/05/2015     04/07/15 ABI's:  Mildly reduced ABI's bilaterally.  Right TBI moderately reduced and left TBI mildly reduced.       ??? DME (diabetic macular edema) right eye 09/06/2014   ??? NPDR (nonproliferative diabetic retinopathy) both eyes 01/08/2014   ??? NS (nuclear sclerosis) both eyes 01/08/2014   ??? Type II or unspecified type diabetes mellitus with ophthalmic manifestations, uncontrolled(250.52) (HCC) 01/08/2014   ??? Anxiety 01/16/2010     10/18/13 Patient tol Wellbutrin well, but indicates she still develops episodes of anxiety QHS. She describes worrying/anxiety cycles and she indicates symptoms of social phobia  10/11/13 Restarted Wellbutrin     ??? Overweight 11/06/2009   ??? Atrial fibrillation (HCC) 12/31/2008   ??? Coronary artery disease involving native coronary artery of native heart without angina pectoris 12/24/2008     1. Cardiac catheterization 12/24/2008, 95 percent mid LAD stenosis, 80 percent proximal first obtuse marginal stenosis, 95 percent  right coronary artery posterior descending stenosis, ejection fraction 60 percent, normal filling pressures.  2. 12/25/2008 coronary artery bypass graft surgery, Vincente Liberty, LIMA to the LAD, rSVG diagonal> first obtuse marginal, and a separate rSVG sequentially RPDA>RPLV.  3. 01/15/10 Exercise Echo:  EF 55%.  No ischemia.  4. 10/13/11 Exercise Echo:  EF 60%.  No significant valvular abnormalities.  No ischemia.  5. 02/19/14 Echo:  EF 60%.  No significant valvular abnormalities.  6. 05/09/15 Regadenoson Thallium:  EF 67%.  No ischemia.  7. 07/19/17 Echo:  EF 55%. Nodular thickening on the aortic valve without stenosis or insufficiency.  PAP=68mmHg  8. 07/19/17 Regadenoson Thallium:  EF 67%.  No ischemia.     ??? HLD (hyperlipidemia) 12/24/2008            ??? Depression 12/24/2008   ??? Diabetes mellitus, type II (HCC) 02/07/2008     Medications  Linagliptin-metformin 2.5-1000mg  BID, glimepiride 2mg  QDay   ACE/ARB: Losartan 50mg  QDay    Dilated Eye Exam (q1y):   08/2014 Signs of nonproliferative diabetic retinopathy     Foot Exam (q1y):   09/04/14   Right foot: 6/10  Left foot: 5/10    Dental Exam (q10m):   Performs regularly    Labs   Last A1c (q93m):   Lab Results   Component Value Date    HGBA1C 10.8 06/26/2010    HGBA1C 8.8 12/24/2008    HGBA1C 7.5 11/12/2008    A1C 7.5 03/24/2015    A1C 9.4 09/04/2014    A1C 7.6 02/06/2014    HGBPOC 9.5 12/25/2008    HGBPOC 9.2 12/25/2008    HGBPOC 8.8 12/25/2008       Microalbumin (q1y):   MICROALBUMIN/CR RATIO URINE Date Value Ref Range Status   03/17/2015 12.50 <30 ug/mg Final     Comment:     NOTE NEW REFERENCE RANGES   10/19/2013 31.32 MG/GRAM Final   06/26/2010 9.93 MG/GRAM Final     MICROALBUMIN, RANDOM   Date Value Ref Range Status   03/17/2015 7.0 <19 MCG/ML Final   10/19/2013 21.3* <19 MCG/ML Final   06/26/2010 15.0 <19 MCG/ML Final        Lipid profile (q1y):   Lab Results   Component Value Date    CHOL 152 01/08/2014    TRIG 293* 01/08/2014    HDL 37* 01/08/2014    LDL 84 01/08/2014    VLDL 59 01/08/2014    NONHDLCHOL 115 01/08/2014        Immunizations:   Pneumovax/Prevnar: Pneumovax 11/2013  Flu: Receives annually at work  TDap: Not up to date  Hep B (  46-59 yo): 09/04/14  Zoster ( 44 yo or older): Not indicated yet    BP: Controlled  Smoking: Denies    Aspirin 81mg     Atorvastatin 40mg  QDay     ??? Essential hypertension with goal blood pressure less than 140/90 02/07/2008         Review of Systems   Constitution: Negative.   HENT: Negative.    Eyes: Negative.    Cardiovascular: Negative.    Respiratory: Negative.    Endocrine: Negative.    Hematologic/Lymphatic: Negative.    Skin: Negative.    Musculoskeletal: Negative.    Gastrointestinal: Negative.    Genitourinary: Negative.    Neurological: Negative.    Psychiatric/Behavioral: Negative.    Allergic/Immunologic: Negative.        Physical Exam  General Appearance: not in acute distress   Neck Veins: neck veins are not distended   Cardiovascular system:  S1 S2 heard, no murmurs, rub or gallop.  Carotid Arteries: normal carotid upstroke bilaterally, no bruits   Respiratory system: No use of accessory muscles, Normal vesicular breath sounds over all the lung fields bilaterally, No added sounds  Abdominal Exam: soft, non-tende, bowel sounds normal, no organomegaly   Extremity: Left leg wrapped up in dressing  due to recent left leg torn Achilles tendon surgery.  Gait: Wheelchair bound due to recent left leg torn Achilles tendon surgery Muscle Strength: normal muscle tone   Neurologic Exam: alert and oriented x 3, neurological assessment grossly intact     Cardiovascular Studies  ECG done today in clinic showed sinus rhythm with right bundle branch block.    Problems Addressed Today  Encounter Diagnoses   Name Primary?   ??? Coronary artery disease involving native coronary artery of native heart without angina pectoris Yes   ??? Essential hypertension with goal blood pressure less than 140/90    ??? Mixed hyperlipidemia    ??? PAC (premature atrial contraction)      Current Medications (including today's revisions)  ??? aflibercept(+) (EYLEA) 2 mg/0.05 mL intravitreal injection    ??? ascorbic acid (VITAMIN C) 500 mg tablet Take 500 mg by mouth daily.   ??? aspirin EC 81 mg tablet Take 1 tablet by mouth daily. Take with food. (Patient taking differently: Take 81 mg by mouth at bedtime daily. Take with food.)   ??? atorvastatin (LIPITOR) 40 mg tablet Take 1 tablet by mouth daily.   ??? buPROPion SR(+) (WELLBUTRIN-SR) 150 mg tablet Take 1 tablet by mouth daily.   ??? calcium carbonate/vitamin D-3 (OSCAL-500+D) 1250 mg/200 unit tablet Take 1 tablet by mouth three times daily with meals. Calcium Carb 1250mg  delivers 500mg  elemental Ca   ??? carvedilol (COREG) 12.5 mg tablet Take 1 tablet by mouth twice daily with food.   ??? ferrous sulfate (FEOSOL, FEROSUL) 325 mg (65 mg iron) tablet Take 325 mg by mouth daily. Take on an empty stomach at least 1 hour before or 2 hours after food.   ??? losartan (COZAAR) 25 mg tablet Take 1 tablet by mouth daily.   ??? Miscellaneous Medical Supply misc Please check your blood pressure twice a day   ??? multivit-min-iron-FA-lutein (CENTRUM SILVER WOMEN) 8 mg iron-400 mcg-300 mcg tab Take 1 tablet by mouth daily.   ??? polycarbophil (FIBER-CAPS (CA POLYCARBOPHIL)) 625 mg tablet Take 625 mg by mouth daily.   ??? wheat dextrin (BENEFIBER SUGAR FREE (DEXTRIN)) 3 gram/3.8 gram powd

## 2017-08-23 DIAGNOSIS — E7800 Pure hypercholesterolemia, unspecified: ICD-10-CM

## 2017-08-23 DIAGNOSIS — I2510 Atherosclerotic heart disease of native coronary artery without angina pectoris: Principal | ICD-10-CM

## 2017-08-23 DIAGNOSIS — I491 Atrial premature depolarization: ICD-10-CM

## 2017-08-23 DIAGNOSIS — I10 Essential (primary) hypertension: ICD-10-CM

## 2017-08-24 ENCOUNTER — Ambulatory Visit
Admit: 2017-08-24 | Discharge: 2017-08-25 | Payer: Commercial Managed Care - HMO | Primary: Student in an Organized Health Care Education/Training Program

## 2017-08-24 ENCOUNTER — Ambulatory Visit
Admit: 2017-08-24 | Discharge: 2017-08-24 | Payer: Commercial Managed Care - HMO | Primary: Student in an Organized Health Care Education/Training Program

## 2017-08-24 DIAGNOSIS — M25512 Pain in left shoulder: Principal | ICD-10-CM

## 2017-08-24 DIAGNOSIS — M62838 Other muscle spasm: ICD-10-CM

## 2017-08-24 DIAGNOSIS — S46912A Strain of unspecified muscle, fascia and tendon at shoulder and upper arm level, left arm, initial encounter: ICD-10-CM

## 2017-08-24 MED ORDER — METAXALONE 800 MG PO TAB
800 mg | ORAL_TABLET | Freq: Every evening | ORAL | 0 refills | Status: AC | PRN
Start: 2017-08-24 — End: ?

## 2017-08-28 ENCOUNTER — Encounter
Admit: 2017-08-28 | Discharge: 2017-08-28 | Payer: Private Health Insurance - Indemnity | Primary: Student in an Organized Health Care Education/Training Program

## 2017-08-28 DIAGNOSIS — T7840XA Allergy, unspecified, initial encounter: ICD-10-CM

## 2017-08-28 DIAGNOSIS — G709 Myoneural disorder, unspecified: ICD-10-CM

## 2017-08-28 DIAGNOSIS — I10 Essential (primary) hypertension: ICD-10-CM

## 2017-08-28 DIAGNOSIS — Z9289 Personal history of other medical treatment: ICD-10-CM

## 2017-08-28 DIAGNOSIS — E785 Hyperlipidemia, unspecified: ICD-10-CM

## 2017-08-28 DIAGNOSIS — K219 Gastro-esophageal reflux disease without esophagitis: ICD-10-CM

## 2017-08-28 DIAGNOSIS — E1165 Type 2 diabetes mellitus with hyperglycemia: ICD-10-CM

## 2017-08-28 DIAGNOSIS — I776 Arteritis, unspecified: ICD-10-CM

## 2017-08-28 DIAGNOSIS — S62109A Fracture of unspecified carpal bone, unspecified wrist, initial encounter for closed fracture: ICD-10-CM

## 2017-08-28 DIAGNOSIS — F419 Anxiety disorder, unspecified: ICD-10-CM

## 2017-08-28 DIAGNOSIS — I2510 Atherosclerotic heart disease of native coronary artery without angina pectoris: ICD-10-CM

## 2017-08-28 DIAGNOSIS — F329 Major depressive disorder, single episode, unspecified: ICD-10-CM

## 2017-09-06 ENCOUNTER — Encounter
Admit: 2017-09-06 | Discharge: 2017-09-06 | Payer: Private Health Insurance - Indemnity | Primary: Student in an Organized Health Care Education/Training Program

## 2017-09-08 ENCOUNTER — Ambulatory Visit
Admit: 2017-09-08 | Discharge: 2017-09-08 | Payer: Commercial Managed Care - HMO | Primary: Student in an Organized Health Care Education/Training Program

## 2017-09-08 DIAGNOSIS — E782 Mixed hyperlipidemia: ICD-10-CM

## 2017-09-08 DIAGNOSIS — I2510 Atherosclerotic heart disease of native coronary artery without angina pectoris: Secondary | ICD-10-CM

## 2017-09-08 DIAGNOSIS — D649 Anemia, unspecified: Principal | ICD-10-CM

## 2017-09-08 LAB — LIPID PROFILE
Lab: 133 mg/dL — ABNORMAL LOW (ref <200)
Lab: 135 mg/dL — ABNORMAL LOW (ref <150)
Lab: 74 mg/dL (ref <100)
Lab: 92 mg/dL (ref 11–15)

## 2017-09-08 LAB — CBC
Lab: 10 10*3/uL (ref 4.5–11.0)
Lab: 3.5 M/UL — ABNORMAL LOW (ref 4.0–5.0)

## 2017-09-08 LAB — ALT (SGPT): Lab: 31 U/L (ref 7–56)

## 2017-09-08 LAB — AST (SGOT): Lab: 25 U/L (ref >40)

## 2017-09-13 ENCOUNTER — Encounter
Admit: 2017-09-13 | Discharge: 2017-09-13 | Payer: Private Health Insurance - Indemnity | Primary: Student in an Organized Health Care Education/Training Program

## 2017-09-13 DIAGNOSIS — E782 Mixed hyperlipidemia: Principal | ICD-10-CM

## 2017-09-13 DIAGNOSIS — I2510 Atherosclerotic heart disease of native coronary artery without angina pectoris: ICD-10-CM

## 2017-09-15 ENCOUNTER — Encounter
Admit: 2017-09-15 | Discharge: 2017-09-15 | Payer: Private Health Insurance - Indemnity | Primary: Student in an Organized Health Care Education/Training Program

## 2017-09-16 ENCOUNTER — Encounter
Admit: 2017-09-16 | Discharge: 2017-09-16 | Payer: Private Health Insurance - Indemnity | Primary: Student in an Organized Health Care Education/Training Program

## 2017-09-16 MED ORDER — ATORVASTATIN 80 MG PO TAB
80 mg | ORAL_TABLET | Freq: Every day | ORAL | 1 refills | Status: AC
Start: 2017-09-16 — End: ?

## 2017-09-16 NOTE — Telephone Encounter
-----   Message from Sampson Goon, RN sent at 09/16/2017  7:54 AM CDT -----  Regarding: FW: Prescription Question  Contact: [phone]      ----- Message -----  From: Luretha Rued, RN  Sent: 09/15/2017   1:55 PM  To: Sampson Goon, RN  Subject: FW: Prescription Question                            ----- Message -----  From: Earlyne Iba  Sent: 09/15/2017   7:18 AM  To: [facility] Triage [place]  Subject: Prescription Question                            I received a message that I need to start taking 80 mg atorvastatin.  Could you please send the prescription to the [facility] at [place].  [phone].    Thanks,    Penelope Coop  [phone]

## 2017-09-16 NOTE — Telephone Encounter
-----   Message from Sampson Goon, RN sent at 09/16/2017  7:54 AM CDT -----  Regarding: FW: Prescription Question  Contact: [phone]      ----- Message -----  From: Luretha Rued, RN  Sent: 09/15/2017   1:55 PM  To: Sampson Goon, RN  Subject: FW: Prescription Question                            ----- Message -----  From: Earlyne Iba  Sent: 09/15/2017   7:18 AM  To: [facility] Nurse Triage [place]  Subject: Prescription Question                            I received a message that I need to start taking 80 mg atorvastatin.  Could you please send the prescription to the [facility] pharmacy at [place].  [phone].    Thanks,    Sara Mahoney  [phone]

## 2017-09-19 ENCOUNTER — Encounter
Admit: 2017-09-19 | Discharge: 2017-09-19 | Payer: Private Health Insurance - Indemnity | Primary: Student in an Organized Health Care Education/Training Program

## 2017-09-19 DIAGNOSIS — D649 Anemia, unspecified: Principal | ICD-10-CM

## 2017-09-19 NOTE — Progress Notes
Patient notified of results via MyChart    Continued anemia.  Patient with history of Gastric bypass which would predispose her for IDA.  Will check Iron levels.  If positive, will recommend IV iron infusion.

## 2017-09-26 ENCOUNTER — Encounter
Admit: 2017-09-26 | Discharge: 2017-09-26 | Payer: Private Health Insurance - Indemnity | Primary: Student in an Organized Health Care Education/Training Program

## 2017-09-26 MED ORDER — LOSARTAN 25 MG PO TAB
25 mg | ORAL_TABLET | Freq: Every day | ORAL | 11 refills | 30.00000 days | Status: AC
Start: 2017-09-26 — End: ?

## 2017-11-15 ENCOUNTER — Encounter
Admit: 2017-11-15 | Discharge: 2017-11-15 | Payer: Private Health Insurance - Indemnity | Primary: Student in an Organized Health Care Education/Training Program

## 2017-11-15 MED ORDER — BUPROPION SR 150 MG PO TBSR
150 mg | ORAL_TABLET | Freq: Every day | ORAL | 2 refills | Status: AC
Start: 2017-11-15 — End: ?

## 2017-11-15 NOTE — Telephone Encounter
Refill for Bupropion sent to the pharmacy via e-scribe

## 2017-12-01 ENCOUNTER — Encounter
Admit: 2017-12-01 | Discharge: 2017-12-01 | Payer: Private Health Insurance - Indemnity | Primary: Student in an Organized Health Care Education/Training Program

## 2017-12-01 ENCOUNTER — Ambulatory Visit
Admit: 2017-12-01 | Discharge: 2017-12-01 | Payer: Commercial Managed Care - HMO | Primary: Student in an Organized Health Care Education/Training Program

## 2017-12-01 DIAGNOSIS — F329 Major depressive disorder, single episode, unspecified: ICD-10-CM

## 2017-12-01 DIAGNOSIS — Z9289 Personal history of other medical treatment: ICD-10-CM

## 2017-12-01 DIAGNOSIS — I2510 Atherosclerotic heart disease of native coronary artery without angina pectoris: ICD-10-CM

## 2017-12-01 DIAGNOSIS — K219 Gastro-esophageal reflux disease without esophagitis: ICD-10-CM

## 2017-12-01 DIAGNOSIS — I776 Arteritis, unspecified: ICD-10-CM

## 2017-12-01 DIAGNOSIS — E1165 Type 2 diabetes mellitus with hyperglycemia: ICD-10-CM

## 2017-12-01 DIAGNOSIS — E785 Hyperlipidemia, unspecified: ICD-10-CM

## 2017-12-01 DIAGNOSIS — T7840XA Allergy, unspecified, initial encounter: ICD-10-CM

## 2017-12-01 DIAGNOSIS — Z9884 Bariatric surgery status: ICD-10-CM

## 2017-12-01 DIAGNOSIS — I10 Essential (primary) hypertension: ICD-10-CM

## 2017-12-01 DIAGNOSIS — E118 Type 2 diabetes mellitus with unspecified complications: Principal | ICD-10-CM

## 2017-12-01 DIAGNOSIS — G709 Myoneural disorder, unspecified: ICD-10-CM

## 2017-12-01 DIAGNOSIS — F419 Anxiety disorder, unspecified: ICD-10-CM

## 2017-12-01 DIAGNOSIS — S62109A Fracture of unspecified carpal bone, unspecified wrist, initial encounter for closed fracture: ICD-10-CM

## 2017-12-12 ENCOUNTER — Encounter
Admit: 2017-12-12 | Discharge: 2017-12-12 | Payer: Private Health Insurance - Indemnity | Primary: Student in an Organized Health Care Education/Training Program

## 2017-12-12 NOTE — Telephone Encounter
Per chart notes form 9/18, patient was to increase Lipitor to 80 mg and have Lipids, AST, and ALT in 3 months. Orders in O2.    I sent MyChart message to patient as reminder.

## 2018-01-11 NOTE — Telephone Encounter
Noted pt read My Chart message on 12/12/17. Not completed as of 01/11/18. LN

## 2018-04-06 ENCOUNTER — Encounter
Admit: 2018-04-06 | Discharge: 2018-04-06 | Payer: Private Health Insurance - Indemnity | Primary: Student in an Organized Health Care Education/Training Program

## 2018-06-10 ENCOUNTER — Ambulatory Visit
Admit: 2018-06-10 | Discharge: 2018-06-10 | Payer: Commercial Managed Care - HMO | Primary: Student in an Organized Health Care Education/Training Program

## 2018-06-10 DIAGNOSIS — Z1231 Encounter for screening mammogram for malignant neoplasm of breast: Principal | ICD-10-CM

## 2018-06-23 ENCOUNTER — Encounter
Admit: 2018-06-23 | Discharge: 2018-06-23 | Payer: Private Health Insurance - Indemnity | Primary: Student in an Organized Health Care Education/Training Program

## 2018-06-23 DIAGNOSIS — Z1239 Encounter for other screening for malignant neoplasm of breast: Principal | ICD-10-CM

## 2018-07-04 ENCOUNTER — Encounter
Admit: 2018-07-04 | Discharge: 2018-07-04 | Payer: Private Health Insurance - Indemnity | Primary: Student in an Organized Health Care Education/Training Program

## 2018-10-16 ENCOUNTER — Encounter
Admit: 2018-10-16 | Discharge: 2018-10-16 | Payer: Private Health Insurance - Indemnity | Primary: Student in an Organized Health Care Education/Training Program

## 2018-10-17 ENCOUNTER — Ambulatory Visit
Admit: 2018-10-17 | Discharge: 2018-10-17 | Payer: Private Health Insurance - Indemnity | Primary: Student in an Organized Health Care Education/Training Program

## 2018-10-17 ENCOUNTER — Encounter
Admit: 2018-10-17 | Discharge: 2018-10-17 | Payer: Private Health Insurance - Indemnity | Primary: Student in an Organized Health Care Education/Training Program

## 2018-10-17 ENCOUNTER — Ambulatory Visit
Admit: 2018-10-17 | Discharge: 2018-10-17 | Payer: Commercial Managed Care - HMO | Primary: Student in an Organized Health Care Education/Training Program

## 2018-10-17 DIAGNOSIS — K219 Gastro-esophageal reflux disease without esophagitis: ICD-10-CM

## 2018-10-17 DIAGNOSIS — S6992XA Unspecified injury of left wrist, hand and finger(s), initial encounter: Principal | ICD-10-CM

## 2018-10-17 DIAGNOSIS — S62109A Fracture of unspecified carpal bone, unspecified wrist, initial encounter for closed fracture: ICD-10-CM

## 2018-10-17 DIAGNOSIS — I2510 Atherosclerotic heart disease of native coronary artery without angina pectoris: ICD-10-CM

## 2018-10-17 DIAGNOSIS — T7840XA Allergy, unspecified, initial encounter: ICD-10-CM

## 2018-10-17 DIAGNOSIS — E1165 Type 2 diabetes mellitus with hyperglycemia: ICD-10-CM

## 2018-10-17 DIAGNOSIS — F329 Major depressive disorder, single episode, unspecified: ICD-10-CM

## 2018-10-17 DIAGNOSIS — I10 Essential (primary) hypertension: ICD-10-CM

## 2018-10-17 DIAGNOSIS — Z9289 Personal history of other medical treatment: ICD-10-CM

## 2018-10-17 DIAGNOSIS — G709 Myoneural disorder, unspecified: ICD-10-CM

## 2018-10-17 DIAGNOSIS — E785 Hyperlipidemia, unspecified: ICD-10-CM

## 2018-10-17 DIAGNOSIS — I776 Arteritis, unspecified: ICD-10-CM

## 2018-10-17 DIAGNOSIS — F419 Anxiety disorder, unspecified: ICD-10-CM

## 2019-04-15 ENCOUNTER — Encounter
Admit: 2019-04-15 | Discharge: 2019-04-15 | Payer: Private Health Insurance - Indemnity | Primary: Student in an Organized Health Care Education/Training Program

## 2019-04-15 ENCOUNTER — Ambulatory Visit
Admit: 2019-04-15 | Discharge: 2019-04-16 | Payer: Private Health Insurance - Indemnity | Primary: Student in an Organized Health Care Education/Training Program

## 2019-04-15 DIAGNOSIS — I10 Essential (primary) hypertension: ICD-10-CM

## 2019-04-15 DIAGNOSIS — I2510 Atherosclerotic heart disease of native coronary artery without angina pectoris: ICD-10-CM

## 2019-04-15 DIAGNOSIS — E1165 Type 2 diabetes mellitus with hyperglycemia: ICD-10-CM

## 2019-04-15 DIAGNOSIS — S62109A Fracture of unspecified carpal bone, unspecified wrist, initial encounter for closed fracture: ICD-10-CM

## 2019-04-15 DIAGNOSIS — F419 Anxiety disorder, unspecified: ICD-10-CM

## 2019-04-15 DIAGNOSIS — T7840XA Allergy, unspecified, initial encounter: ICD-10-CM

## 2019-04-15 DIAGNOSIS — K219 Gastro-esophageal reflux disease without esophagitis: ICD-10-CM

## 2019-04-15 DIAGNOSIS — F329 Major depressive disorder, single episode, unspecified: ICD-10-CM

## 2019-04-15 DIAGNOSIS — Z9289 Personal history of other medical treatment: ICD-10-CM

## 2019-04-15 DIAGNOSIS — E785 Hyperlipidemia, unspecified: ICD-10-CM

## 2019-04-15 DIAGNOSIS — G709 Myoneural disorder, unspecified: ICD-10-CM

## 2019-04-15 DIAGNOSIS — I776 Arteritis, unspecified: ICD-10-CM

## 2019-04-15 MED ORDER — CIPROFLOXACIN HCL 500 MG PO TAB
500 mg | ORAL_TABLET | Freq: Two times a day (BID) | ORAL | 0 refills | 10.00000 days | Status: DC
Start: 2019-04-15 — End: 2019-04-15

## 2019-04-15 MED ORDER — SULFAMETHOXAZOLE-TRIMETHOPRIM 800-160 MG PO TAB
1 | ORAL_TABLET | Freq: Two times a day (BID) | ORAL | 0 refills | Status: AC
Start: 2019-04-15 — End: ?

## 2019-04-15 NOTE — Patient Instructions
Urinary Tract Infections in Women    Urinary tract infections (UTIs) are most often caused by???bacteria. These bacteria enter the urinary tract. The bacteria may come from inside the body. Or they may travel from the skin outside the???rectum or vagina into the urethra. Female anatomy makes it easy for bacteria from the bowel???to enter a woman???s urinary tract, which is the most common source of UTI. This means women develop UTIs more often than men. Pain in or around the urinary tract is a common UTI symptom. But the only way to know for sure if you have a UTI for the healthcare provider to test your urine. The two tests that may be done are the urinalysis and urine culture.   Types of UTIs  ??? Cystitis. A bladder infection (cystitis) is the most common UTI in women. You may have urgent or frequent need to pee. You may also have???pain, burning when you pee, and bloody urine.  ??? Urethritis. This is an inflamed urethra, which is the tube that carries urine from the bladder to outside the body. You may have lower stomach or back pain. You may also have urgent or frequent need to pee.  ??? Pyelonephritis. This is a kidney infection. If not treated, it can be serious and damage your kidneys. In severe cases, you may need to stay in the???hospital. You may have a fever and lower back pain.    Medicines to treat a UTI  Most UTIs are treated with antibiotics. These kill the bacteria. The length of time you need to take them depends on the type of infection. It may be as short as 3 days. If you have repeated UTIs, you may need a low-dose antibiotic???for several months. Take antibiotics exactly as directed. Don???t stop taking them until all of the medicine is gone. If you stop taking the antibiotic too soon, the infection may not go away. You may also develop a resistance to the antibiotic. This can make it much harder to treat.   Lifestyle changes to treat and prevent UTIs

## 2019-04-16 ENCOUNTER — Encounter
Admit: 2019-04-16 | Discharge: 2019-04-16 | Payer: Private Health Insurance - Indemnity | Primary: Student in an Organized Health Care Education/Training Program

## 2019-04-16 DIAGNOSIS — E1165 Type 2 diabetes mellitus with hyperglycemia: ICD-10-CM

## 2019-04-16 DIAGNOSIS — G709 Myoneural disorder, unspecified: ICD-10-CM

## 2019-04-16 DIAGNOSIS — I2510 Atherosclerotic heart disease of native coronary artery without angina pectoris: ICD-10-CM

## 2019-04-16 DIAGNOSIS — F419 Anxiety disorder, unspecified: ICD-10-CM

## 2019-04-16 DIAGNOSIS — R300 Dysuria: Principal | ICD-10-CM

## 2019-04-16 DIAGNOSIS — Z9289 Personal history of other medical treatment: ICD-10-CM

## 2019-04-16 DIAGNOSIS — I10 Essential (primary) hypertension: ICD-10-CM

## 2019-04-16 DIAGNOSIS — E785 Hyperlipidemia, unspecified: ICD-10-CM

## 2019-04-16 DIAGNOSIS — K219 Gastro-esophageal reflux disease without esophagitis: ICD-10-CM

## 2019-04-16 DIAGNOSIS — F329 Major depressive disorder, single episode, unspecified: ICD-10-CM

## 2019-04-16 DIAGNOSIS — T7840XA Allergy, unspecified, initial encounter: ICD-10-CM

## 2019-04-16 DIAGNOSIS — S62109A Fracture of unspecified carpal bone, unspecified wrist, initial encounter for closed fracture: ICD-10-CM

## 2019-04-16 DIAGNOSIS — I776 Arteritis, unspecified: ICD-10-CM

## 2019-04-16 NOTE — Progress Notes
For women who tend to get UTIs after sex, a low-dose of a preventive antibiotic may be used. Be sure to discuss this option with your healthcare provider.  ??? Follow up with your healthcare provider as directed. He or she may test to make sure the infection has cleared. If needed, more treatment may be started.  [employer] last reviewed this educational content on 06/26/2018  ??? 2000-2020 [employer], [place]. [address], [place], [place] [address]. All rights reserved. This information is not intended as a substitute for professional medical care. Always follow your healthcare professional's instructions.

## 2019-04-17 ENCOUNTER — Encounter
Admit: 2019-04-17 | Discharge: 2019-04-16 | Payer: Private Health Insurance - Indemnity | Primary: Student in an Organized Health Care Education/Training Program

## 2019-04-18 LAB — CULTURE-URINE W/SENSITIVITY: Lab: 3

## 2019-05-31 ENCOUNTER — Encounter: Admit: 2019-05-31 | Discharge: 2019-05-31 | Primary: Student in an Organized Health Care Education/Training Program

## 2019-06-05 ENCOUNTER — Encounter: Admit: 2019-06-05 | Discharge: 2019-06-05 | Primary: Student in an Organized Health Care Education/Training Program

## 2019-06-05 NOTE — Telephone Encounter
Left message for Lawrencia to see if we could schedule a telehealth visit with Lawana Pai on 6/11

## 2019-06-26 IMAGING — MG MAMMO SCREEN BILAT/TOMO/CAD
3 of 18 series · 3 of 40 positions shown · non-contrast
Comparison: none

Last mammogram was performed 4 years and 6 months ago.
REASON FOR EXAM: screening, asymptomatic. 
F2LPAOA DIGITAL MAMMO SCREEN BILAT/TOMO/CAD: June 10, 2018 -  
ACCESSION #: [id number] 
3D Procedure
3D Routine views. 
2D Synthetic Routine views. 
Technologist: Odd-Gunnar Vassvik 
There are scattered areas of fibroglandular density.  2D, and 3D  
images were obtained. No masses, densities or calcifications to 
suggest malignancy. No change when compared to prior studies. 
[id number]
IMPRESSION
ACR BI-RADS Assessments: BIRAD 1-Negative 
RECOMMENDATION: 
Routine screening mammogram in 1 year.

[R CC synth-2D]
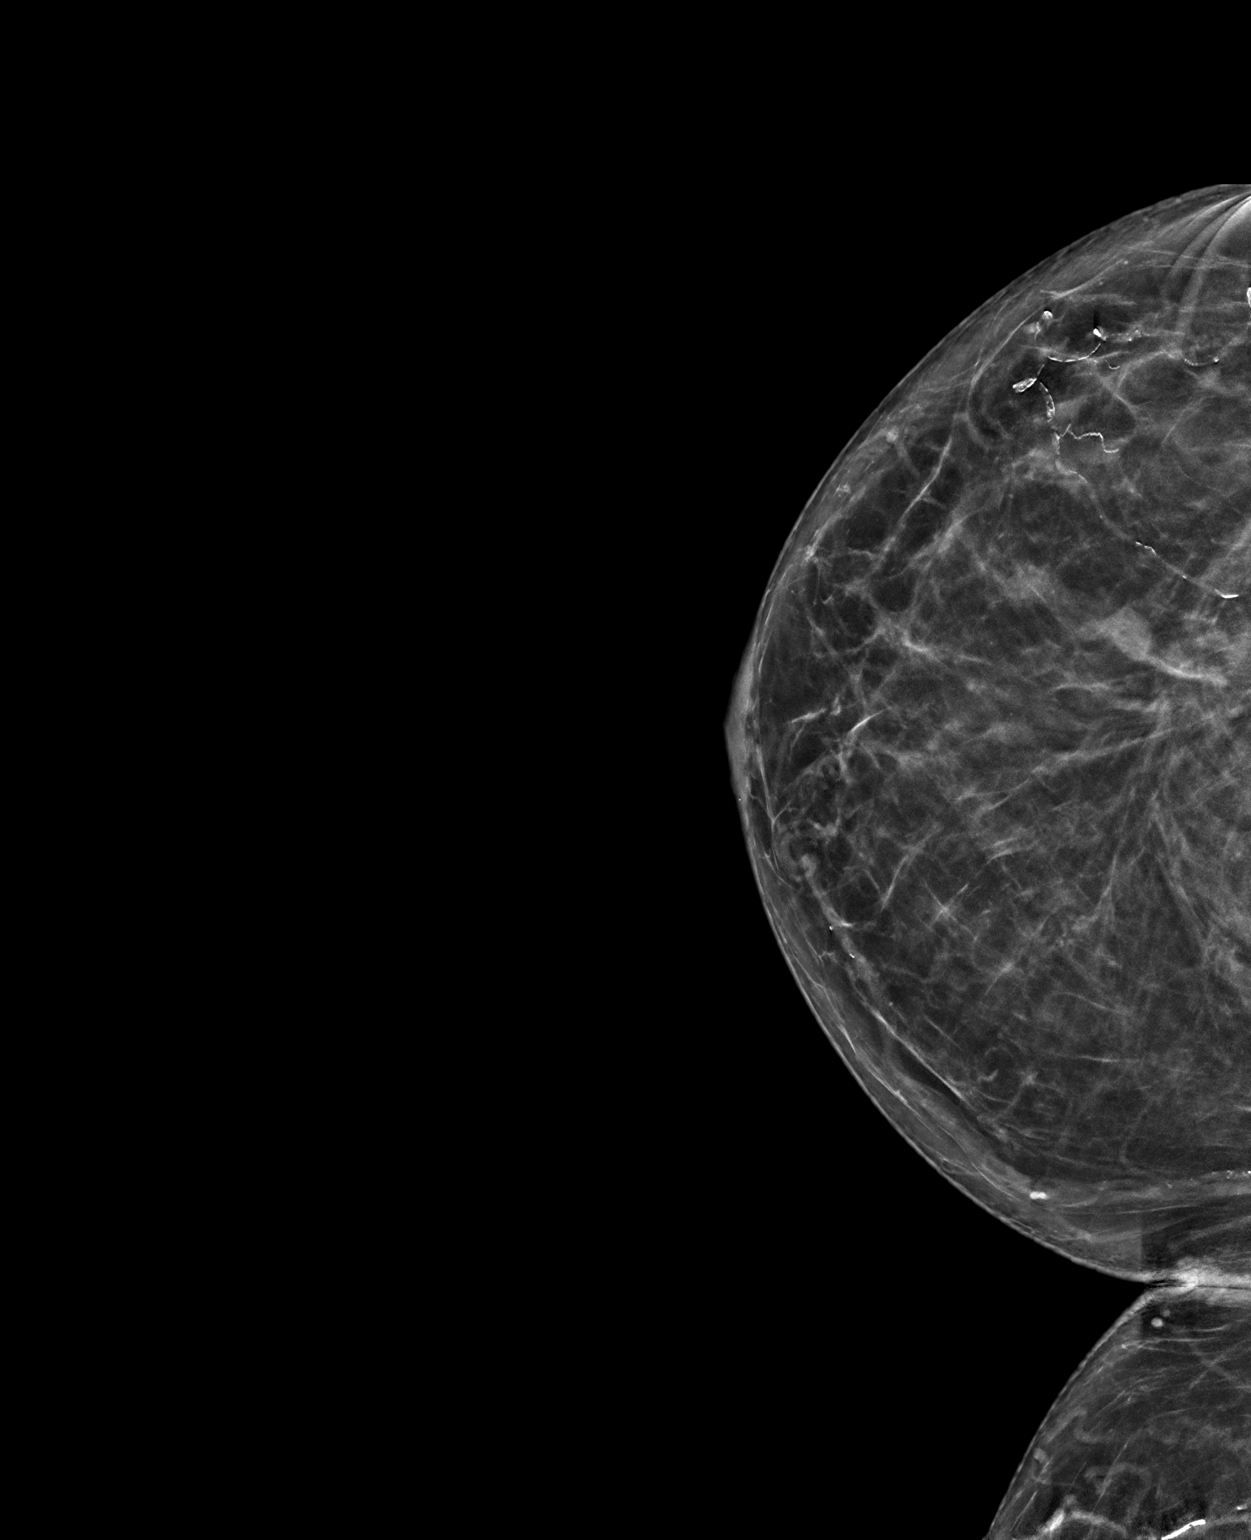

[R MLO synth-2D]
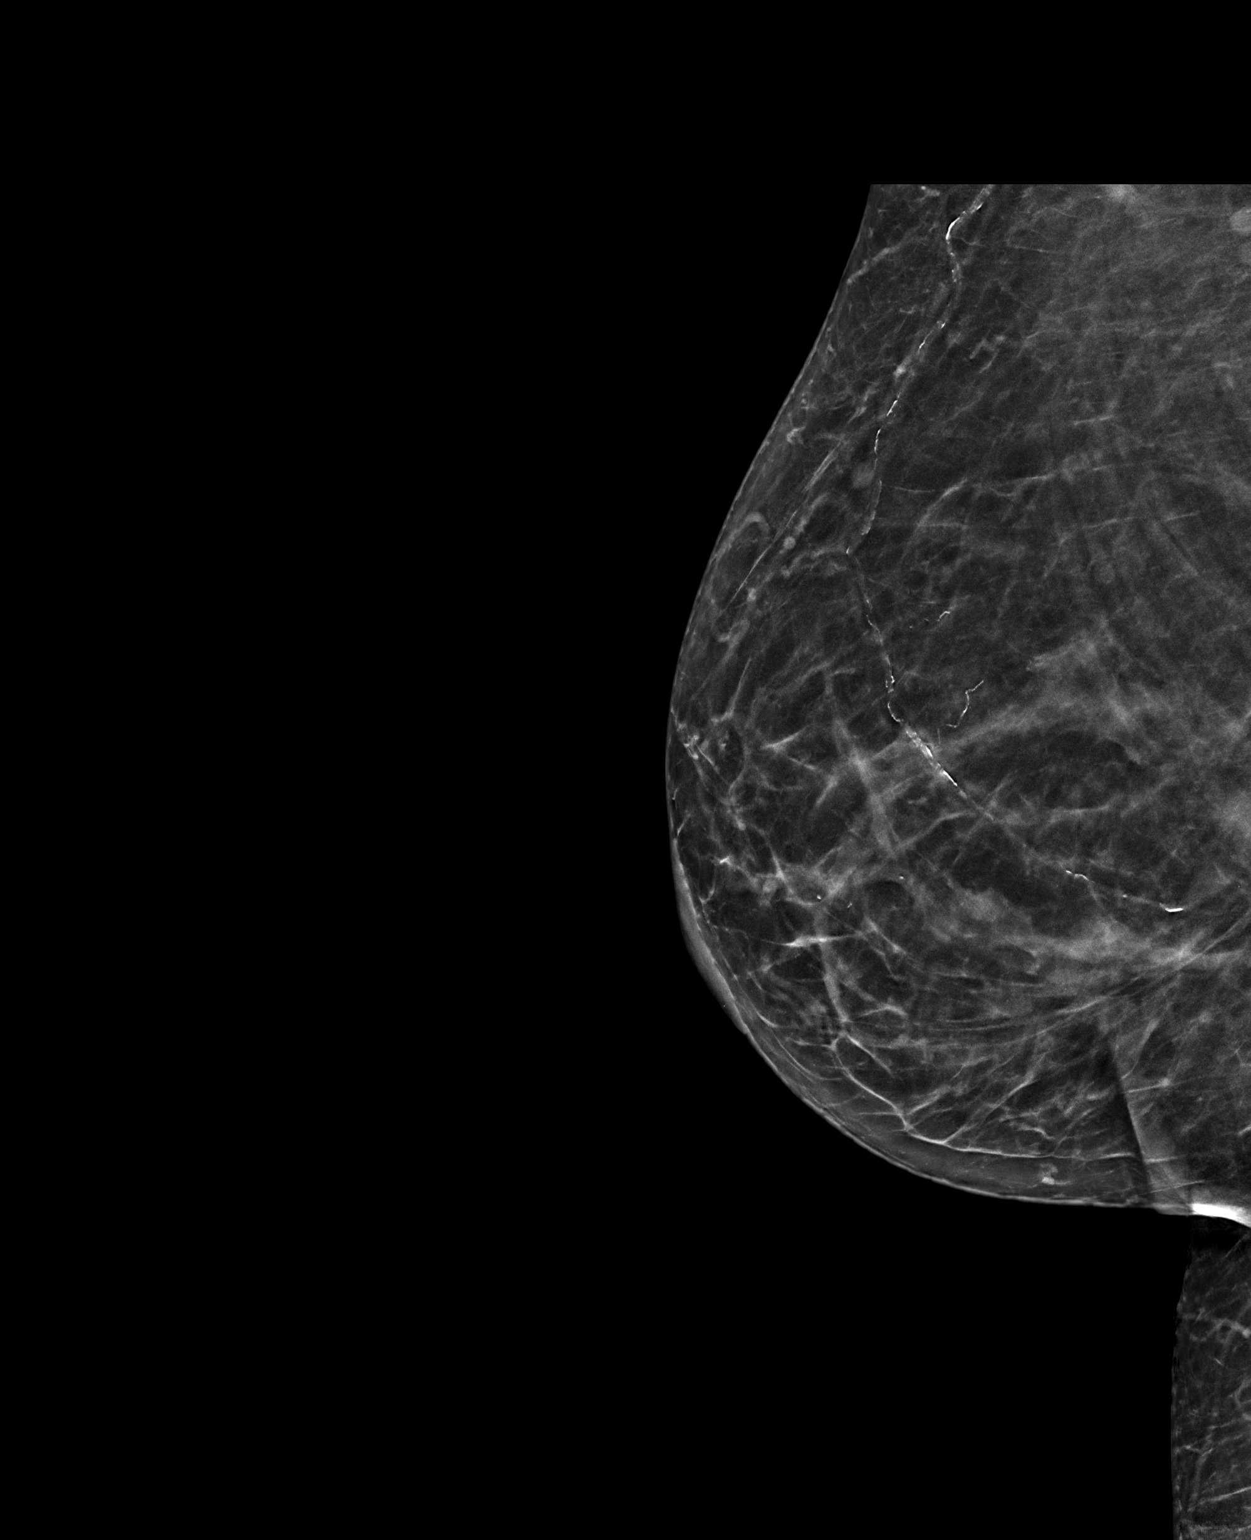

[L CC synth-2D]
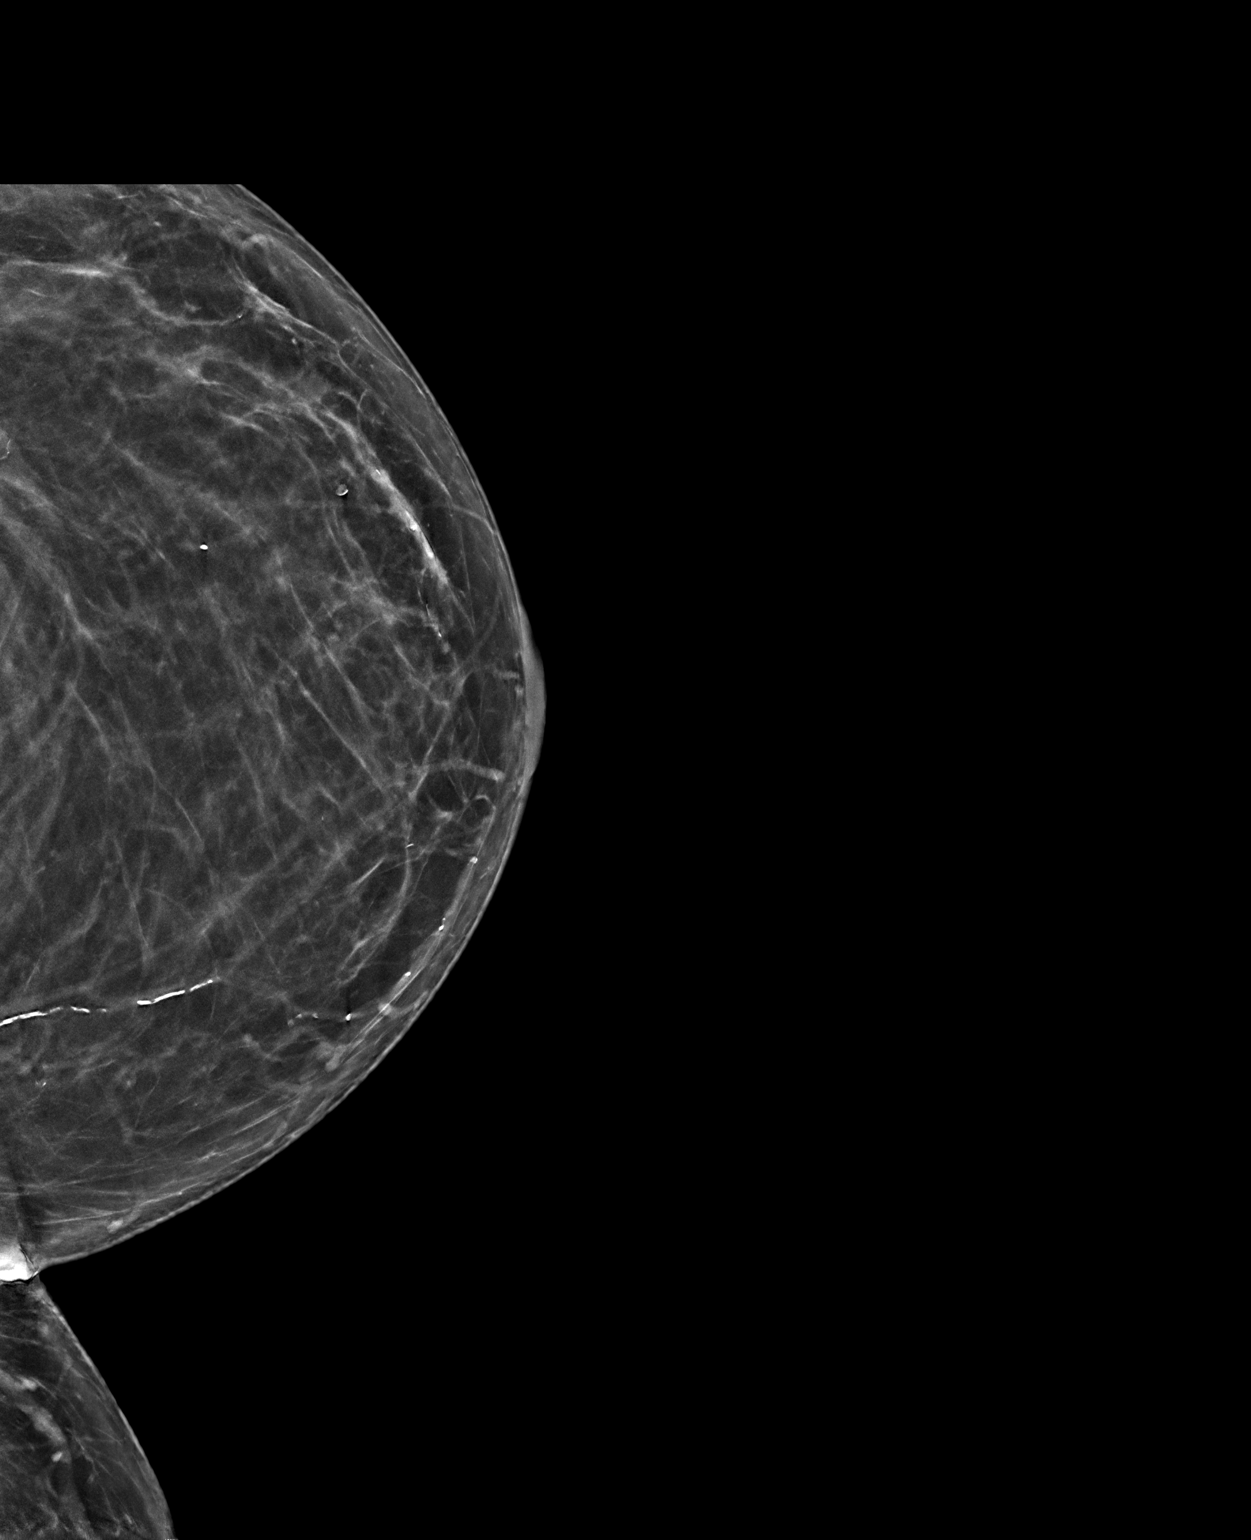

[3 of 40 positions shown; findings below may reference images not displayed]

## 2023-03-28 ENCOUNTER — Encounter
Admit: 2023-03-28 | Discharge: 2023-03-28 | Payer: Private Health Insurance - Indemnity | Primary: Student in an Organized Health Care Education/Training Program

## 2023-05-19 ENCOUNTER — Encounter
Admit: 2023-05-19 | Discharge: 2023-05-19 | Payer: Private Health Insurance - Indemnity | Primary: Student in an Organized Health Care Education/Training Program

## 2023-05-27 ENCOUNTER — Encounter
Admit: 2023-05-27 | Discharge: 2023-05-27 | Payer: Private Health Insurance - Indemnity | Primary: Student in an Organized Health Care Education/Training Program

## 2023-05-31 ENCOUNTER — Encounter
Admit: 2023-05-31 | Discharge: 2023-05-31 | Payer: Private Health Insurance - Indemnity | Primary: Student in an Organized Health Care Education/Training Program

## 2023-05-31 NOTE — Telephone Encounter
05/31/2023 Records request faxed to Dr. Janett Billow [phone], [facility] [phone], and [facility] - [address] [phone] per [employer] task below.  sdc    Cardiologist, Name Unknown, [facility], [address]  Pcp, Dr. Aldean Jewett, [facility] [phone]

## 2023-08-21 ENCOUNTER — Encounter: Admit: 2023-08-21 | Discharge: 2023-08-21 | Payer: Private Health Insurance - Indemnity | Primary: Family

## 2023-08-21 NOTE — Progress Notes
**  STAT RECORDS REQUEST**    Patient: Sara Mahoney, DOB: 09-Feb-1961  Patient has upcoming appt with [facility] per referral from Aldean Jewett, NP.     For continuity of care, please fax the following to [phone] (attn: Dr Duane Lope):  All office visit notes from last 12 months  All EKG imaging (not just reports) from last 12 months  Any cardiac testing (no matter the date): echo, stress test, heart monitor, calcium score/CT chest, etc.   All lab results drawn in last 12 months  Most recent cholesterol lab results (no matter the date)    Please fax back or call [phone] to notify us of any issues with this request.     Thanks!  Toni Amend, RN

## 2023-08-23 ENCOUNTER — Encounter: Admit: 2023-08-23 | Discharge: 2023-08-23 | Payer: Private Health Insurance - Indemnity | Primary: Family

## 2023-08-23 DIAGNOSIS — I779 Disorder of arteries and arterioles, unspecified: Secondary | ICD-10-CM

## 2023-08-23 DIAGNOSIS — R002 Palpitations: Secondary | ICD-10-CM

## 2023-08-23 DIAGNOSIS — I2510 Atherosclerotic heart disease of native coronary artery without angina pectoris: Secondary | ICD-10-CM

## 2023-08-23 DIAGNOSIS — I9789 Other postprocedural complications and disorders of the circulatory system, not elsewhere classified: Secondary | ICD-10-CM

## 2023-08-23 DIAGNOSIS — Z951 Presence of aortocoronary bypass graft: Secondary | ICD-10-CM

## 2023-08-23 NOTE — Progress Notes
CARDIAC NEW PATIENT PROFILE      PHYSICIANS INFORMATION:   REFERRING PHYSICIAN: Aldean Jewett, NP    PCP: Aldean Jewett, NP        REASON FOR VISIT/DIAGNOSIS: PCP referral for HTN (pt has not seen PCP since 08/2022)      RECENT EVENTS/SYMPTOMS:     Last saw Dr Benedict Needy in 2018. Pt moved to [facility] and established care with Dr Francene Castle ([facility]) on 06/09/21. Dr Sherral Hammers ordered echo & follow up appt. Confirmed with his office that pt never completed echo or returned for another appt.     Called PCP office, confirmed that the last office visit they have on file is dated 09/22/22 (annual wellness exam).     08/22/2023 11:12 AM   Called pt, LMOM to inquire if any recent cardiac issues, ER visits or hospitalizations? Has she seen any other cardiologist since last saw JAN in 2018 & Dr Sherral Hammers in 2022? Left cb number, asked that pt call back and let us know so we can request records if needed prior to [facility] OV 9/4.       PCP OV note 09/22/22:            PERTINENT CARDIAC HISTORY:   CAD s/p CABG 2009  HTN  HLD  Occasional PACs  Atrial fibrillation  Peripheral arterial occlusive disease      OTHER MEDICAL HISTORY:   Type II DM  Restless leg syndrome  Anxiety  Obesity      MOST RECENT PERTINENT TESTING/PROCEDURES:   03/12/21 - RLE venous US ([facility]ia [employer]):  No evidence of right lower extremity DVT. RLE deep venous system shows normal compressibility with normal response to augmentation and valsalva. No fluid collection or mass detected.     04/03/21  - Ankle Brachial Index ([facility]):  Right: normal ABI. Mildly abnormal TBI. Left: Normal ABI & TBI.       PERTINENT SURGERIES:   Gastric bypass (2017)  CABG x5 (2009)  L ankle surgery (2018)      PERTINENT CARDIAC FAMILY HISTORY:     Mother HTN  HLD  DM    Father Heart attack (died from it at 82 y/o)         LABS:   Cholesterol   Date Value Ref Range Status   09/22/2022 153  Final     Triglycerides   Date Value Ref Range Status   09/22/2022 213 Final     HDL   Date Value Ref Range Status   09/22/2022 45  Final     LDL   Date Value Ref Range Status   09/22/2022 77  Final     Hemoglobin A1C   Date Value Ref Range Status   06/01/2023 9.0  Final     Poc Hemoglobin A1C   Date Value Ref Range Status   12/01/2017 5.7 % Final     Troponin-I   Date Value Ref Range Status   07/19/2017 <0.01 0.0 - 0.05 NG/ML Final       RECORDS: records in [place]   Previous  cardiology records in chart      IMAGES:   Requested imaging from [facility] [employer]

## 2023-08-31 ENCOUNTER — Encounter: Admit: 2023-08-31 | Discharge: 2023-08-31 | Payer: Commercial Managed Care - PPO | Primary: Family

## 2023-08-31 ENCOUNTER — Ambulatory Visit: Admit: 2023-08-31 | Discharge: 2023-08-31 | Payer: Commercial Managed Care - PPO | Primary: Family

## 2023-08-31 DIAGNOSIS — Z136 Encounter for screening for cardiovascular disorders: Secondary | ICD-10-CM

## 2023-08-31 DIAGNOSIS — R011 Cardiac murmur, unspecified: Secondary | ICD-10-CM

## 2023-08-31 DIAGNOSIS — E785 Hyperlipidemia, unspecified: Secondary | ICD-10-CM

## 2023-08-31 DIAGNOSIS — K219 Gastro-esophageal reflux disease without esophagitis: Secondary | ICD-10-CM

## 2023-08-31 DIAGNOSIS — I998 Other disorder of circulatory system: Secondary | ICD-10-CM

## 2023-08-31 DIAGNOSIS — I776 Arteritis, unspecified: Secondary | ICD-10-CM

## 2023-08-31 DIAGNOSIS — I10 Essential (primary) hypertension: Secondary | ICD-10-CM

## 2023-08-31 DIAGNOSIS — F419 Anxiety disorder, unspecified: Secondary | ICD-10-CM

## 2023-08-31 DIAGNOSIS — I2510 Atherosclerotic heart disease of native coronary artery without angina pectoris: Secondary | ICD-10-CM

## 2023-08-31 DIAGNOSIS — F32A Depression: Secondary | ICD-10-CM

## 2023-08-31 DIAGNOSIS — S62109A Fracture of unspecified carpal bone, unspecified wrist, initial encounter for closed fracture: Secondary | ICD-10-CM

## 2023-08-31 DIAGNOSIS — Z8249 Family history of ischemic heart disease and other diseases of the circulatory system: Secondary | ICD-10-CM

## 2023-08-31 DIAGNOSIS — E782 Mixed hyperlipidemia: Secondary | ICD-10-CM

## 2023-08-31 DIAGNOSIS — R0609 Other forms of dyspnea: Secondary | ICD-10-CM

## 2023-08-31 DIAGNOSIS — G709 Myoneural disorder, unspecified: Secondary | ICD-10-CM

## 2023-08-31 DIAGNOSIS — I9789 Other postprocedural complications and disorders of the circulatory system, not elsewhere classified: Secondary | ICD-10-CM

## 2023-08-31 DIAGNOSIS — Z9289 Personal history of other medical treatment: Secondary | ICD-10-CM

## 2023-08-31 DIAGNOSIS — T7840XA Allergy, unspecified, initial encounter: Secondary | ICD-10-CM

## 2023-08-31 DIAGNOSIS — IMO0002 Diabetes mellitus type II, uncontrolled: Secondary | ICD-10-CM

## 2023-08-31 MED ORDER — METFORMIN 500 MG PO TB24
1000 mg | Freq: Two times a day (BID) | ORAL | 0 refills | Status: AC
Start: 2023-08-31 — End: ?

## 2023-08-31 MED ORDER — EMPAGLIFLOZIN 25 MG PO TAB
25 mg | Freq: Every day | ORAL | 0 refills | Status: AC
Start: 2023-08-31 — End: ?

## 2023-08-31 MED ORDER — EZETIMIBE 10 MG PO TAB
10 mg | ORAL_TABLET | Freq: Every day | ORAL | 1 refills | Status: AC
Start: 2023-08-31 — End: ?

## 2023-08-31 NOTE — Patient Instructions
Follow up plan:  Please follow up with Roberts Gaudy, PA-C in 6 months, schedule today. You can call scheduling at [phone] to make or change your appointment. Please schedule 1-2 months in advance (when possible) to assure a timely appointment.     Please follow up with Dr. Duane Lope in 1 year, due Sept 2025. You can call scheduling at [phone] to make or change your appointment. Please schedule 6 months in advance (when possible) to assure a timely appointment.     Please schedule a chemical stress test and a Resting Cardiac Echocardiogram , next available time. Schedule today at the front desk.      Please start Zetia 10mg  daily. A new script was sent to your RX.   Please get a FASTING  lab draw in 3 months as we need to recheck your lipid levels after medication changes. We will call you to set up labs closer to that date.     If you have any questions/concerns please call the [facility] Team, nurse's for Dr. Duane Lope, at [phone] or send an email via MyChart. We attempt to responds to messages received by 3pm Monday thru Friday the same day.     Thank you for choosing [place] for your cardiac care!  Laroy Apple, RN, BSN      Contacting our office:    -For NON-URGENT questions please contact us via message through your MyChart account.   -For all medication refills please contact your pharmacy or send a request through MyChart.     -For all questions that may need to be addressed urgently please call the [facility] nursing triage line at [phone] Monday - Friday 8am-5pm only. Please leave a detailed message with your name, date of birth, and reason for your call.  If your message is received before 3:30pm, every effort will be made to call you back the same day.  Please allow time for [place] to review your chart prior to call back.     -Should you have an urgent concern over the weekend/nights, the on-call triage line is [phone].    -[facility] nursing team fax number: [phone]    -You may receive a survey in the upcoming weeks from [facility] [place]. Your feedback is important to [place] and helps [place] continue to improve patient care and patient satisfaction.     -Please feel free to call our Financial Department at [phone] with any questions or concerns about estimated cost of testing or imaging ordered today. We are happy to provide CPT codes upon request.    Results & Testing Follow Up:    -Please allow 5-7 business days for the results of any testing to be reviewed. Please call our office if you have not heard from a nurse within this time frame.    -Should you choose to complete testing at an outside facility, please contact our office after completion of testing so that we can ensure that we have received results for your provider to review.    Lab and test results:  As a part of the CARES act, starting 03/27/2020, some results will be released to you via MyChart immediately and automatically.  You may see results before your provider sees them; however, your provider will review all these results and then they, or one of their team, will notify you of result information and recommendations.   Critical results will be addressed immediately, but otherwise, please allow [place] time to get back with you prior to you reaching  out to [place] for questions.  This will usually take about 72 hours for labs and 5-7 days for procedure test results.

## 2023-09-04 ENCOUNTER — Encounter: Admit: 2023-09-04 | Discharge: 2023-09-04 | Payer: Commercial Managed Care - PPO | Primary: Family

## 2023-09-04 DIAGNOSIS — G709 Myoneural disorder, unspecified: Secondary | ICD-10-CM

## 2023-09-04 DIAGNOSIS — I2510 Atherosclerotic heart disease of native coronary artery without angina pectoris: Secondary | ICD-10-CM

## 2023-09-04 DIAGNOSIS — K219 Gastro-esophageal reflux disease without esophagitis: Secondary | ICD-10-CM

## 2023-09-04 DIAGNOSIS — IMO0002 Diabetes mellitus type II, uncontrolled: Secondary | ICD-10-CM

## 2023-09-04 DIAGNOSIS — F419 Anxiety disorder, unspecified: Secondary | ICD-10-CM

## 2023-09-04 DIAGNOSIS — S62109A Fracture of unspecified carpal bone, unspecified wrist, initial encounter for closed fracture: Secondary | ICD-10-CM

## 2023-09-04 DIAGNOSIS — T7840XA Allergy, unspecified, initial encounter: Secondary | ICD-10-CM

## 2023-09-04 DIAGNOSIS — I10 Essential (primary) hypertension: Secondary | ICD-10-CM

## 2023-09-04 DIAGNOSIS — F32A Depression: Secondary | ICD-10-CM

## 2023-09-04 DIAGNOSIS — I776 Arteritis, unspecified: Secondary | ICD-10-CM

## 2023-09-04 DIAGNOSIS — Z9289 Personal history of other medical treatment: Secondary | ICD-10-CM

## 2023-09-04 DIAGNOSIS — E785 Hyperlipidemia, unspecified: Secondary | ICD-10-CM

## 2023-09-12 ENCOUNTER — Encounter: Admit: 2023-09-12 | Discharge: 2023-09-12 | Payer: Commercial Managed Care - PPO | Primary: Family

## 2023-09-12 NOTE — Patient Instructions
[facility]    Nuclear Stress Test Instructions    Your cardiologist has asked that you have a nuclear stress test (also known as a Myocardial Perfusion Imaging (MPI) test.    This evaluation of your heart muscle consists of two sets of nuclear images and either a Treadmill stress test or a chemical stress test, decided by you and your physician.    You will get an IV placed in your arm for the test.    You will need to be able to raise your arm up by your head for about 20 minutes and lie on your back for about 10 minutes.  Please discuss this with your doctor or talk with the nuclear technologist or nurse if these are a problem for you.    It is recommended not to schedule any other appointment for the same day.      Wear comfortable clothing and walking shoes if you are walking on the treadmill.  Women should wear shorts or comfortable pants instead of dresses.   Sweatshirts or T-shirts work really well for imaging.    If you have a Zio Patch cardiac monitor in place, please reschedule your nuclear stress test after your monitoring period is complete. If you would like to proceed with the nuclear stress test during the monitoring time, the patch will have to be removed and the data will be lost; we can place a new patch following testing.       If you have a cardiac monitor with replacement patches, please inform the nurse prior to your appointment.  The monitor will need to be removed during testing. Please bring replacement patches with you so that we can resume monitoring following your test.     There may be enough time to leave to get a snack after the stress portion of the test.   The Technologist will tell you what time to return for the second set of images.    PLEASE NO CAFFEINE 24 HOURS PRIOR TO TEST:  Examples include coffee, tea, decaffeinated drinks, colas, [facility], Dr. Reino Kent.  Some orange sodas and root beers have caffeine, please check.  No energy drinks, Excedrin, Midol, or any foods CONTAINING CHOCOLATE.   Consuming Caffeine may postpone your test.    PLEASE DO NOT EAT OR DRINK THE MORNING OF YOUR TEST.  Water is ok to drink with your morning medications.    Please hold these medications the day of test:  None    DIABETIC PATIENTS:  if insulin dependent:  please take one third of your insulin with two pieces of dry toast and a small juice.  Bring remaining 2/3   insulin and oral diabetic medications with you to your test.    PLEASE NO TOBACCO PRODUCTS BETWEEN SCANS  You will NOT need a driver for this test.  But are welcome to bring a visitor with you.   Visitors will not be able to accompany you back to stress room.   Please do not bring children to the nuclear stress test.    TEST FINDINGS:   You will receive the results of the test within 7 business days of completion of this test by telephone. If you have any questions concerning your nuclear stress test or if you do not hear from your cardiologist/or nurse with 7 business days, please call our office.

## 2023-09-14 ENCOUNTER — Encounter: Admit: 2023-09-14 | Discharge: 2023-09-14 | Payer: Commercial Managed Care - PPO | Primary: Family

## 2023-09-14 ENCOUNTER — Ambulatory Visit: Admit: 2023-09-14 | Discharge: 2023-09-14 | Payer: Commercial Managed Care - PPO | Primary: Family

## 2023-09-14 DIAGNOSIS — R0609 Other forms of dyspnea: Secondary | ICD-10-CM

## 2023-09-14 DIAGNOSIS — I2510 Atherosclerotic heart disease of native coronary artery without angina pectoris: Secondary | ICD-10-CM

## 2023-09-14 DIAGNOSIS — Z8249 Family history of ischemic heart disease and other diseases of the circulatory system: Secondary | ICD-10-CM

## 2023-09-14 DIAGNOSIS — I998 Other disorder of circulatory system: Secondary | ICD-10-CM

## 2023-09-14 MED ORDER — ALBUTEROL SULFATE 90 MCG/ACTUATION IN HFAA
2 | RESPIRATORY_TRACT | 0 refills | Status: AC | PRN
Start: 2023-09-14 — End: ?

## 2023-09-14 MED ORDER — NITROGLYCERIN 0.4 MG SL SUBL
.4 mg | SUBLINGUAL | 0 refills | Status: AC | PRN
Start: 2023-09-14 — End: ?

## 2023-09-14 MED ORDER — RP DX TC-99M TETROFOSMIN MCI
8 | Freq: Once | INTRAVENOUS | 0 refills | Status: CP
Start: 2023-09-14 — End: ?
  Administered 2023-09-14: 13:00:00 8 via INTRAVENOUS

## 2023-09-14 MED ORDER — EUCALYPTUS-MENTHOL MM LOZG
1 | Freq: Once | ORAL | 0 refills | Status: AC | PRN
Start: 2023-09-14 — End: ?

## 2023-09-14 MED ORDER — AMINOPHYLLINE 25 MG/ML IV SOLN
50 mg | INTRAVENOUS | 0 refills | Status: AC | PRN
Start: 2023-09-14 — End: ?

## 2023-09-14 MED ORDER — RP DX TC-99M TETROFOSMIN MCI
24 | Freq: Once | INTRAVENOUS | 0 refills | Status: CP
Start: 2023-09-14 — End: ?
  Administered 2023-09-14: 16:00:00 24 via INTRAVENOUS

## 2023-09-14 MED ORDER — SODIUM CHLORIDE 0.9 % IV SOLP
250 mL | INTRAVENOUS | 0 refills | Status: AC | PRN
Start: 2023-09-14 — End: ?

## 2023-09-14 MED ORDER — REGADENOSON 0.4 MG/5 ML IV SYRG
.4 mg | Freq: Once | INTRAVENOUS | 0 refills | Status: CP
Start: 2023-09-14 — End: ?
  Administered 2023-09-14: 13:00:00 0.4 mg via INTRAVENOUS

## 2023-09-16 ENCOUNTER — Encounter: Admit: 2023-09-16 | Discharge: 2023-09-16 | Payer: Commercial Managed Care - PPO | Primary: Family

## 2023-10-11 ENCOUNTER — Encounter: Admit: 2023-10-11 | Discharge: 2023-10-11 | Payer: Commercial Managed Care - PPO | Primary: Family

## 2023-10-11 ENCOUNTER — Ambulatory Visit: Admit: 2023-10-11 | Discharge: 2023-10-11 | Payer: Commercial Managed Care - PPO | Primary: Family

## 2023-10-11 DIAGNOSIS — I10 Essential (primary) hypertension: Secondary | ICD-10-CM

## 2023-10-11 DIAGNOSIS — R011 Cardiac murmur, unspecified: Secondary | ICD-10-CM

## 2023-10-11 MED ORDER — PERFLUTREN LIPID MICROSPHERES 1.1 MG/ML IV SUSP
1-10 mL | Freq: Once | INTRAVENOUS | 0 refills | Status: CP | PRN
Start: 2023-10-11 — End: ?

## 2023-10-11 MED ORDER — SODIUM CHLORIDE 0.9 % IJ SOLN
10 mL | Freq: Once | INTRAVENOUS | 0 refills | Status: CP
Start: 2023-10-11 — End: ?

## 2023-10-12 ENCOUNTER — Encounter: Admit: 2023-10-12 | Discharge: 2023-10-12 | Payer: Commercial Managed Care - PPO | Primary: Family

## 2023-11-16 ENCOUNTER — Encounter: Admit: 2023-11-16 | Discharge: 2023-11-16 | Payer: Commercial Managed Care - PPO | Primary: Family

## 2023-11-30 ENCOUNTER — Encounter: Admit: 2023-11-30 | Discharge: 2023-11-30 | Payer: Commercial Managed Care - PPO | Primary: Family

## 2023-12-01 ENCOUNTER — Encounter: Admit: 2023-12-01 | Discharge: 2023-12-01 | Payer: Commercial Managed Care - PPO | Primary: Family

## 2023-12-01 ENCOUNTER — Ambulatory Visit: Admit: 2023-12-01 | Discharge: 2023-12-02 | Payer: Commercial Managed Care - PPO | Primary: Family

## 2023-12-02 ENCOUNTER — Encounter: Admit: 2023-12-02 | Discharge: 2023-12-02 | Payer: Commercial Managed Care - PPO | Primary: Family

## 2024-02-02 ENCOUNTER — Encounter: Admit: 2024-02-02 | Discharge: 2024-02-02 | Payer: Commercial Managed Care - PPO | Primary: Family

## 2024-03-01 ENCOUNTER — Encounter: Admit: 2024-03-01 | Discharge: 2024-03-01 | Payer: Commercial Managed Care - PPO | Primary: Family

## 2024-03-20 ENCOUNTER — Encounter: Admit: 2024-03-20 | Discharge: 2024-03-20 | Payer: Commercial Managed Care - PPO | Primary: Family

## 2024-05-14 ENCOUNTER — Encounter: Admit: 2024-05-14 | Discharge: 2024-05-14 | Payer: PRIVATE HEALTH INSURANCE | Primary: Family

## 2024-06-05 ENCOUNTER — Ambulatory Visit: Admit: 2024-06-05 | Discharge: 2024-06-06 | Payer: PRIVATE HEALTH INSURANCE | Primary: Family

## 2024-06-05 ENCOUNTER — Encounter: Admit: 2024-06-05 | Discharge: 2024-06-05 | Payer: PRIVATE HEALTH INSURANCE | Primary: Family

## 2024-08-07 ENCOUNTER — Encounter: Admit: 2024-08-07 | Discharge: 2024-08-07 | Payer: PRIVATE HEALTH INSURANCE | Primary: Family

## 2024-08-20 ENCOUNTER — Encounter: Admit: 2024-08-20 | Discharge: 2024-08-20 | Payer: PRIVATE HEALTH INSURANCE | Primary: Family

## 2024-08-21 ENCOUNTER — Encounter: Admit: 2024-08-21 | Discharge: 2024-08-21 | Payer: PRIVATE HEALTH INSURANCE | Primary: Family

## 2024-08-22 ENCOUNTER — Encounter: Admit: 2024-08-22 | Discharge: 2024-08-22 | Payer: PRIVATE HEALTH INSURANCE | Primary: Family

## 2024-08-22 NOTE — Telephone Encounter
 08/22/2024 10:07 AM   Called and spoke with patient. Scheduled OV with SWB on 9/19 at 12:00 Pm at the [facility].     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  She was treated medically.    SWB

## 2024-08-24 ENCOUNTER — Encounter: Admit: 2024-08-24 | Discharge: 2024-08-24 | Payer: PRIVATE HEALTH INSURANCE | Primary: Family

## 2024-08-24 ENCOUNTER — Emergency Department
Admit: 2024-08-24 | Discharge: 2024-08-24 | Disposition: A | Discharge status: Home / Self Care | Payer: PRIVATE HEALTH INSURANCE

## 2024-08-24 ENCOUNTER — Emergency Department: Admit: 2024-08-24 | Discharge: 2024-08-24 | Payer: PRIVATE HEALTH INSURANCE

## 2024-08-28 ENCOUNTER — Encounter: Admit: 2024-08-28 | Discharge: 2024-08-28 | Payer: PRIVATE HEALTH INSURANCE | Primary: Family

## 2024-08-30 ENCOUNTER — Encounter: Admit: 2024-08-30 | Discharge: 2024-08-30 | Payer: PRIVATE HEALTH INSURANCE | Primary: Family

## 2024-08-31 ENCOUNTER — Encounter: Admit: 2024-08-31 | Discharge: 2024-08-31 | Payer: PRIVATE HEALTH INSURANCE | Primary: Family

## 2024-08-31 ENCOUNTER — Ambulatory Visit: Admit: 2024-08-31 | Discharge: 2024-09-01 | Payer: PRIVATE HEALTH INSURANCE | Primary: Family

## 2024-09-01 ENCOUNTER — Encounter: Admit: 2024-09-01 | Discharge: 2024-09-01 | Payer: PRIVATE HEALTH INSURANCE | Primary: Family

## 2024-09-03 ENCOUNTER — Encounter: Admit: 2024-09-03 | Discharge: 2024-09-03 | Payer: PRIVATE HEALTH INSURANCE | Primary: Family

## 2024-09-10 ENCOUNTER — Encounter: Admit: 2024-09-10 | Discharge: 2024-09-10 | Payer: PRIVATE HEALTH INSURANCE | Primary: Family

## 2024-09-11 ENCOUNTER — Encounter: Admit: 2024-09-11 | Discharge: 2024-09-11 | Payer: PRIVATE HEALTH INSURANCE | Primary: Family

## 2024-09-11 DIAGNOSIS — Z01810 Encounter for preprocedural cardiovascular examination: Principal | ICD-10-CM

## 2024-09-11 NOTE — Patient Instructions
 CARDIAC CATHETERIZATION   PRE-ADMISSION INSTRUCTIONS    Patient Name: Sara Mahoney  MRN#: [id number]  Date of Birth: 14-Jul-1961 (62 y.o.)  Today's Date: 09/11/2024    PROCEDURE:  You are scheduled for a Coronary Angiogram and visualization of bypass grafts with possible Angioplasty/Stenting with Dr. Prasad Gunasekaran.    PROCEDURE DATE AND ARRIVAL TIME:  Your procedure date is 10/10/24.  You will receive a call from the Cath lab staff between 8:00 a.m. and noon on the business day prior to your procedure to let you know at what time to arrive on the day of your procedure.    Please check in at the Admitting Desk in the [facility] for your procedure.   Address:  [address], [place]  [place], [place] [address]    [facility] Entrance and take a right. Continue down the hallway past the Cardiovascular Medicine office. That hall will take you into the [facility]. Check in at the desk on the left side.)     (If you have further questions regarding your arrival time for the CV lab, please call [phone] by 3:00pm the day before your procedure. Please leave a message with your name and number, your call will be returned in a timely manner.)    PRE-PROCEDURE APPOINTMENTS:    10/6 at 8:30am   Office visit to update history and physical (requirement within 30 days of procedure)  with Therisa Shove, APRN at Cardiovascular Medicine [facility]     To be done same day as office visit   Pre-Admission lab work required within 14 days of procedure: BMP and CBC at the [facility] [place] clinic.          FOOD AND DRINK INSTRUCTIONS  If taking oral or injectable GLP-1 medication: You are either taking an oral or an injectable GLP-1 medication.    Day prior to your procedure: CLEAR LIQUIDS ONLY until 11:00p.m. No solid foods. Chewing gum, mints, and hard candy (e.g. jolly ranchers) are acceptable while on a clear liquid diet. They cannot have milk or nut products in them.    Day of your procedure: Nothing to eat or drink. Sips of water with medications only. No chewing gum, mints, or candy.    Examples of acceptable clear liquids:  -Water  -Ice chips  -Carbonated beverages (caffeine-free)  -Gatorade or other carbohydrate sports drinks (caffeine-free)  -Clear ice pops (without fruit or yogurt)  -Clear juice (apple, grape, or cranberry - no juice with pulp)  -Pedialyte   (Nothing above should have creamer, milk, or pulp. Do not drink clear protein drinks.)    SPECIAL MEDICATION INSTRUCTIONS  Any new prescriptions will be sent to your pharmacy listed on file with us .      Diuretics: Aldactone (spironolactone) -- hold the morning of your procedure.   GLP-1 Medications: tirzepatide (Mounjaro, Zepbound): Hold the morning of your procedure. Pt takes on Monday  Hypoglycemics: metformin  (Glucophage ) -- hold the morning of your procedure.  and empagliflozin  (Jardiance ) -- hold the morning of your procedure.      Is the patient taking oral anticoagulation medication (NOAC, DOAC, or Warfarin)?: No    Does the ordering provider want a Lovenox  bridge before/after cath? N/A    TAKE AM OF PROCEDURE:  Please take either 4 baby aspirin  (4 x 81mg ) or one full strength NON-COATED 325mg  aspirin .  In addition to the aspirin , if you take Clopidogrel (Plavix), Prasugrel (Effient), or Ticagrelor (Brilinta), please take your scheduled dose the morning of  the procedure.  TAKE ALL other medications not discussed above as prescribed. If you are prescribed new medications prior to your procedure or are taking medications that aren't on the list below, please let us  know so we can review prior to your procedure.         HOLD ALL over the counter vitamins or supplements on the morning of your procedure.      Additional Instructions  If you wear CPAP, please bring your mask and machine with you to the hospital.    Take a bath or shower with anti-bacterial soap the evening before, or the morning of the procedure.     Bring photo ID and your health insurance card(s).    Arrange for a driver to take you home from the hospital. Please arrange for a friend or family member to take you home from this test. You cannot take a Taxi, [place], or public transportation as there has to be a responsible person to help care for you after sedation    Bring an accurate list of your current medications with you to the hospital (all medications and supplements taken daily).  Please use the medication list below and write in the date and time when you took your last dose before your procedure. Update this list of medications as needed.      Wear comfortable clothes and don't bring valuables, other than photo identification card, with you to the hospital.    Please pack a bag for an overnight stay.     For patients who are staying overnight on the Cardiovascular Treatment and Recovery Unit (CTR), no visitor(s) will be allowed to sleep at the bedside.    Please review your pre-procedure instructions and bring them with you on the day of your procedure.  Call the office at [phone] with any questions. You may ask to speak with Dr. Elspeth Quails nurse.      ALLERGIES  Allergies   Allergen Reactions    Hazelnut HIVES    Levaquin [Levofloxacin] HIVES     Rash all over body.    Lisinopril COUGH    Nsaids (Non-Steroidal Anti-Inflammatory Drug) SEE COMMENTS     Hx of gastric bypass. Not supposed to take NSAIDs       CURRENT MEDICATIONS  Outpatient Encounter Medications as of 09/11/2024   Medication Sig Dispense Refill    aspirin  EC 81 mg tablet Take 1 tablet by mouth daily. Take with food. 90 tablet 3    atorvastatin  (LIPITOR) 80 mg tablet Take one tablet by mouth daily. 90 tablet 1    carvediloL  (COREG ) 3.125 mg tablet Take one tablet by mouth twice daily with meals. Take with food.      cefpodoxime (VANTIN) 200 mg tablet Take one tablet by mouth every 12 hours. Take with food. 10 tablet 0    clopiDOGreL (PLAVIX) 75 mg tablet Take one tablet by mouth daily.      empagliflozin  (JARDIANCE ) 25 mg tablet Take one tablet by mouth daily.      ezetimibe  (ZETIA ) 10 mg tablet Take one tablet by mouth daily. 90 tablet 1    losartan  (COZAAR ) 25 mg tablet Take one tablet by mouth daily. (Patient not taking: Reported on 08/31/2024) 30 tablet 11    metFORMIN  (GLUCOPHAGE ) 500 mg tablet Take one tablet by mouth twice daily after meals.      Miscellaneous Medical Supply misc Please check your blood pressure twice a day 1 Each 0    MOUNJARO 10 mg/0.5  mL injector PEN       multivit-min-iron -FA-lutein (CENTRUM SILVER WOMEN) 8 mg iron -400 mcg-300 mcg tab Take 1 tablet by mouth daily.      spironolactone (ALDACTONE) 25 mg tablet Take one-half tablet by mouth daily. Take with food. 45 tablet 3     No facility-administered encounter medications on file as of 09/11/2024.       _________________________________________  Form completed by: Tawni Forge, RN  Date completed: 09/11/24  Method: Via telephone and sent to MyChart.

## 2024-09-14 ENCOUNTER — Encounter: Admit: 2024-09-14 | Discharge: 2024-09-14 | Payer: PRIVATE HEALTH INSURANCE | Primary: Family

## 2024-09-20 ENCOUNTER — Encounter: Admit: 2024-09-20 | Discharge: 2024-09-20 | Payer: PRIVATE HEALTH INSURANCE | Primary: Family

## 2024-09-24 ENCOUNTER — Encounter: Admit: 2024-09-24 | Discharge: 2024-09-24 | Payer: PRIVATE HEALTH INSURANCE | Primary: Family

## 2024-09-24 DIAGNOSIS — I429 Cardiomyopathy, unspecified: Secondary | ICD-10-CM

## 2024-09-24 DIAGNOSIS — R002 Palpitations: Secondary | ICD-10-CM

## 2024-09-24 DIAGNOSIS — I10 Essential (primary) hypertension: Principal | ICD-10-CM

## 2024-09-24 DIAGNOSIS — I2510 Atherosclerotic heart disease of native coronary artery without angina pectoris: Secondary | ICD-10-CM

## 2024-09-24 LAB — BASIC METABOLIC PANEL
ANION GAP: 10
BLD UREA NITROGEN: 15
CALCIUM: 8.9
CHLORIDE: 97 — ABNORMAL LOW
CO2: 28
CREATININE: 0.6
GFR ESTIMATED: 108
GLUCOSE,PANEL: 110 — ABNORMAL HIGH
POTASSIUM: 4.4
SODIUM: 135

## 2024-09-25 ENCOUNTER — Encounter: Admit: 2024-09-25 | Discharge: 2024-09-25 | Payer: PRIVATE HEALTH INSURANCE | Primary: Family

## 2024-09-27 NOTE — Progress Notes
 Cardiovascular Medicine     Date of Service: 10/01/2024    HPI     I had the pleasure of seeing Sara Mahoney for a followup visit in the Cardiovascular Medicine Clinic at [facility].     Sara Mahoney as you may recall is a 63 y.o. female patient of Dr. Lazarus with a history of early onset coronary artery disease status post CABG with a LIMA to LAD, SVG to D1 and OM1, and SVG to PLV and PDA (at the age of 35) on December 24, 2008, she was noted to have silent ischemia, she did have postop atrial fibrillation after bypass surgery without recurrence, right bundle branch block, hypertension, dyslipidemia, diabetes, depression, anxiety, elevated BMI, nonproliferative diabetic retinopathy, prior diabetic foot infection, peripheral vascular disease, and prior palpitations.  Unfortunately she sustained a non-STEMI in August 2025, she was treated at [facility] in [facility] , she was not felt to have targets for  revascularization, her EF dropped to 40%, she was treated medically.     Left heart catheterization at [facility] in [place]  on August 21, 2024 showed the left main was patent, the LAD had a CTO, the left circumflex had severe disease, the RCA had a CTO, the LIMA to LAD was patent, the SVG to OM was patent, the SVG to RCA had a CTO.     Patient was seen in clinic 08/31/24 by Dr. Lazarus post hospitalization. Her left heart catheterization from [facility] in [facility]  on August 21, 2024 was reviewed by Dr. Lazarus who identified a target for revascularization. Dr. Lazarus notes a fairly large diagonal branch, this branch is actually larger than the LAD that would be reasonable to undergo revascularization. Revascularization was recommended to help with her symptoms as well as her LV dysfunction.     Patient presents to clinic today for an updated history and physical prior to [facility] scheduled on 10/10/24 with Dr. Gunasekaran. We discussed the procedure in detail including pre procedure instructions. Patient verbalized understanding. Sara Mahoney endorses intermittent dizziness and exertional dyspnea. She denies chest pain/discomfort, lower extremity edema, orthopnea, and syncope. Allergies on file were reviewed. BP in office 115/60 mmHg.     Of note, Sara Mahoney endorses recurrent UTI since August that has been treated with two different antibiotics.          Vitals:    10/01/24 0824 10/01/24 0832   BP:  115/60   BP Source:  Arm, Left Upper   Pulse:  77   SpO2:  100%   O2 Device: None (Room air)    PainSc:  Zero   Weight:  68.9 kg (152 lb)   Height:  162.6 cm (5' 4)     Body mass index is 26.09 kg/m?SABRA     Past Medical History  Patient Active Problem List    Diagnosis Date Noted    Obesity 06/09/2021    Peripheral arterial occlusive disease 06/09/2021     04/07/15 - Resting ABI: Mildly reduced ABI's bilaterally. Right: moderately reduced TBI. Left: mildly reduced TBI  07/18/17 - BLE Venous US : No evidence of acute femoral/popliteal DVT in the bilateral lower extremities.   03/12/21 - RLE venous US  ([facility]ia [employer]): No evidence of right lower extremity DVT. RLE deep venous system shows normal compressibility with normal response to augmentation and valsalva. No fluid collection or mass detected.   04/03/21  - Ankle Brachial Index ([facility]ia [facility]): Right: normal ABI. Mildly abnormal TBI. Left: Normal  ABI & TBI.         PAC (premature atrial contraction) 08/22/2017    Palpitations 07/19/2017     08/13/17 - Event Monitor (14 days): Normal sinus rhythm.  PACs rare. No evidence of atrial fibrillation. 1 event was transmitted with symptoms of dizziness did not correlate with any arrhythmias. Intermittent artifact noted as the patient was shaking or using something that is shaking.  However QRS complexes can be seen much in the right through the artifact.      Diabetic foot infection (CMS-HCC) 04/05/2015     04/07/15 ABI's:  Mildly reduced ABI's bilaterally.  Right TBI moderately reduced and left TBI mildly reduced.        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 She describes worrying/anxiety cycles and she indicates symptoms of social phobia  10/11/13 Restarted Wellbutrin       Overweight 11/06/2009    Postoperative atrial fibrillation (CMS-HCC) 12/26/2008     S/p CABG x5 (12/26/2008).       S/P CABG x 5 12/26/2008     12/26/2008 - CABG x5 (Dr Tess)      Coronary artery disease involving native coronary artery of native heart without angina pectoris 12/24/2008     A.  10/16/2008 Ex Echo EF 60%, RV size and function normal, no significant valvular abnormalities, exercised 6 minutes and 30 seconds, achieved a peak heart rate of 172 which is 98% of the maximum predicted heart rate, there were ischemic changes on the ECG, the exercise echocardiogram showed LAD and RCA ischemia.  B.  12/24/2008 LHC LVEDP 10 to 14 mmHg, left main normal, LAD 60 to 70% stenosis followed by 95% stenosis, D1 had 95% stenosis, left circumflex had an OM1 with a 60 to 70% stenosis, the RCA had a mid 50% stenosis, the PDA had a 95% stenosis, followed by 60% stenosis, the PLV had a 95% stenosis.  C.  12/24/2008 CABG with Dr Tess  with a LIMA to LAD, SVG to D1 and OM1, and SVG to PLV and PDA   D.  01/15/2010 Ex Echo EF 55%, RV function normal, valvular structures not well-seen, exercised 7 minutes and 10 seconds, achieved a peak heart rate of 156 which was 90% of the maximum predicted heart rate, no evidence of ischemia.  E.  10/13/2011 Ex Echo EF 60%, RV size and function normal, trace MR, exercised 6 minutes and 18 seconds, achieved a heart rate of 122 which was 70% of the maximum predicted heart rate, PVCs noted, no evidence of ischemia at the workload achieved.  Overall, it was submaximal.  F.  02/15/2014 Echo EF 60%, RV size and function normal, trace MR, aorta normal in size, no pericardial effusion.  G.  05/09/2015 MPI EF 67%, EDV 66 mL, pulmonary to myocardial count ratio 0.36, no transient ischemic dilatation, no evidence of ischemia.  Overall considered low risk.  H.  07/15/2017 MPI EF 67%, EDV 54 mL, pulmonary to myocardial count ratio 0.25, no transient ischemic dilatation, no evidence of ischemia.  Overall considered low risk.  I.    09/14/2023 MPI EF 71%, EDV 91 mL, the perfusion pattern showed a small fixed defect involving the  apex felt to be due to soft tissue attenuation, definite ischemic change was not noted.  J.  10/11/2023 Echo EF 55 to 60%, diastolic function normal, RV size and function normal, nonspecific mitral valve thickening with mild MR, trace TR, aortic valve sclerosis without stenosis, trace AI, the aortic root was 3.2 cm, ascending aorta is 3.5 cm, there is no pericardial effusion, the right atrial pressure is 3 mmHg, the peak pulmonary pressures 30 mmHg.  K.  08/20/2024 Echo at [facility] in [place]  EF 40%, there is mild diastolic dysfunction, there is diffuse hypokinesis, the RV size and function appeared normal, there is aortic valve sclerosis without stenosis, there was moderate MR, trace TR, aortic root was normal in size, there is no pericardial effusion.    L.  08/20/2024 MPI at [facility] in [place]  EF 41%, the end-diastolic volume was 107 mL, overall the perfusion imaging was felt to be nondiagnostic.    M.  08/21/2024 LHC at [facility] in [place]  he left main was patent, the LAD had a CTO, the left circumflex had severe disease, the RCA had a CTO, the LIMA to LAD was patent, the SVG to OM was patent, the SVG to RCA had a CTO.  The abdominal aortogram and distal runoff showed severe calcification of the right femoral artery, the renal arteries were patent, the distal aorta is mildly diseased, patent bilateral external and common iliac arteries, moderate disease in the right common femoral artery mild to moderate disease in the left common femoral artery.  She was treated medically.        HLD (hyperlipidemia) 12/24/2008             Depression 12/24/2008    Diabetes mellitus, type II (CMS-HCC) 02/07/2008     Medications  Linagliptin-metformin  2.5-1000mg  BID, glimepiride 2mg  QDay   ACE/ARB: Losartan  50mg  QDay    Dilated Eye Exam (q1y):   08/2014 Signs of nonproliferative diabetic retinopathy     Foot Exam (q1y):   09/04/14   Right foot: 6/10  Left foot: 5/10    Dental Exam (q54m):   Performs regularly    Labs   Last A1c (q51m):   Lab Results   Component Value Date    HGBA1C 10.8 06/26/2010    HGBA1C 8.8 12/24/2008    HGBA1C 7.5 11/12/2008    A1C 7.5 03/24/2015    A1C 9.4 09/04/2014    A1C 7.6 02/06/2014    HGBPOC 9.5 12/25/2008    HGBPOC 9.2 12/25/2008    HGBPOC 8.8 12/25/2008       Microalbumin (q1y):   MICROALBUMIN/CR RATIO URINE   Date Value Ref Range Status   03/17/2015 12.50 <30 ug/mg Final     Comment:     NOTE NEW REFERENCE RANGES   10/19/2013 31.32 MG/GRAM Final   06/26/2010 9.93 MG/GRAM Final     MICROALBUMIN, RANDOM   Date Value Ref Range Status   03/17/2015 7.0 <19 MCG/ML Final   10/19/2013 21.3* <19 MCG/ML Final   06/26/2010 15.0 <19 MCG/ML Final        Lipid profile (q1y):   Lab Results   Component Value Date    CHOL 152 01/08/2014    TRIG 293* 01/08/2014    HDL 37* 01/08/2014    LDL 84 01/08/2014    VLDL 59 01/08/2014    NONHDLCHOL 115 01/08/2014        Immunizations:   Pneumovax/Prevnar: Pneumovax 11/2013  Flu: Receives annually at work  TDap: Not up  to date  Hep B (25-59 yo): 09/04/14  Zoster ( 46 yo or older): Not indicated yet    BP: Controlled  Smoking: Denies    Aspirin  81mg     Atorvastatin  40mg  QDay      Primary hypertension 02/07/2008     ROS - See HPI    Physical Exam: General Appearance: no acute distress  Skin: warm & intact  Neck Veins: neck veins are flat & not distended  Carotid Arteries: no bruits  Chest Inspection: chest is normal in appearance  Auscultation/Percussion: lungs clear to auscultation, no rales, rhonchi, or wheezing  Cardiac Rhythm: regular rhythm & normal rate  Cardiac Auscultation: Normal S1 & S2.    Murmurs: no cardiac murmurs   Extremities: no lower extremity edema; 2+ symmetric distal pulses  Neurologic Exam: oriented to time, place and person; no focal neurologic deficits  Psychiatric: Normal mood and affect.  Behavior is normal. Judgment and thought content normal.      Cardiovascular Studies  Preliminary EKG from today: sinus rhythm with RBBB at 78 bpm.   Most recent results for 12-Lead ECG   ECG 12-LEAD    Collection Time: 10/01/24  8:28 AM   Result Value Status    VENTRICULAR RATE 78 Incomplete    P-R INTERVAL 180 Incomplete    QRS DURATION 134 Incomplete    Q-T INTERVAL 432 Incomplete    QTC CALCULATION (BAZETT) 492 Incomplete    P AXIS 65 Incomplete    R AXIS 99 Incomplete    T AXIS 40 Incomplete    Impression    Normal sinus rhythm  Possible Left atrial enlargement  Right bundle branch block  Abnormal ECG  When compared with ECG of 24-Aug-2024 05:41,  Minimal criteria for Inferior infarct are no longer present  T wave inversion less evident in Inferior leads   Most recent results for 12-Lead ECG   [facility] MAIN ECG TRIAGE ONLY    Collection Time: 08/24/24  5:41 AM   Result Value Status    VENTRICULAR RATE 81 Final    P-R INTERVAL 182 Final    QRS DURATION 144 Final    Q-T INTERVAL 428 Final    QTC CALCULATION (BAZETT) 497 Final    P AXIS 36 Final    R AXIS 106 Final    T AXIS -22 Final    Impression    Normal sinus rhythm  Right bundle branch block  When compared with ECG of 31-Aug-2023 09:11,  QRS axis shifted right  Confirmed by Wilene Pride ([id number]) on 08/24/2024 8:13:58 AM      Cardiovascular Health Factors  Vitals BP Readings from Last 3 Encounters:   10/01/24 115/60   08/31/24 120/74   08/24/24 129/69     Wt Readings from Last 3 Encounters:   10/01/24 68.9 kg (152 lb)   08/31/24 72.6 kg (160 lb)   06/05/24 85.2 kg (187 lb 14.4 oz)     BMI Readings from Last 3 Encounters:   10/01/24 26.09 kg/m?   08/31/24 27.46 kg/m?   06/05/24 32.25 kg/m?      Smoking Tobacco Use History[1]   Lipid Profile Cholesterol   Date Value Ref Range Status   12/01/2023 103 <200 mg/dL Final     HDL   Date Value Ref Range Status   12/01/2023 39 (L) >40 mg/dL Final     LDL   Date Value Ref Range Status   12/01/2023 53 <100 mg/dL Final     Triglycerides  Date Value Ref Range Status   12/01/2023 139 <150 mg/dL Final      Blood Sugar Hemoglobin A1C   Date Value Ref Range Status   06/01/2023 9.0  Final     Glucose   Date Value Ref Range Status   09/14/2024 110 (H)  Final   08/24/2024 85 70 - 100 mg/dL Final   87/94/7975 882 (H) 70 - 100 mg/dL Final     Glucose, POC   Date Value Ref Range Status   07/18/2017 84 70 - 100 MG/DL Final   97/90/7982 98  Final   04/11/2015 110 (H) 70 - 100 MG/DL Final          Problems Addressed Today  Encounter Diagnoses   Name Primary?    Coronary artery disease involving native coronary artery of native heart without angina pectoris Yes    S/P CABG x 5     Screening for heart disease     Mixed hyperlipidemia     Primary hypertension        Assessment and Plan     #Coronary artery disease   #status post CABG with a LIMA to LAD, SVG to D1 and OM1, and SVG to PLV and PDA (at the age of 8) on December 24, 2008  - Unfortunately she sustained a non-STEMI in August 2025, she was treated at [facility] in [place] , she was not felt to have targets for  revascularization, her EF dropped to 40%, she was treated medically. Patient was seen in clinic 08/31/24 by Dr. Lazarus post hospitalization. Her left heart catheterization from [facility] in [facility]   on August 21, 2024 was reviewed by Dr. Lazarus who identified a target for revascularization. Dr. Lazarus notes a fairly large diagonal branch, this branch is actually larger than the LAD that would be reasonable to undergo revascularization. Revascularization was recommended to help with her symptoms as well as her LV dysfunction.   - She continues DAPT with ASA 81 mg daily and Plavix 75 mg daily.   - Continues atorvastatin  80 mg daily, Zetia  10 mg daily, and carvedilol  3.125 mg twice daily   - Patient presents to clinic today for an updated history and physical prior to [facility] scheduled on 10/10/24 with Dr. Gunasekaran. We discussed the procedure in detail including pre procedure instructions. Patient verbalized understanding. Sara Mahoney endorses intermittent dizziness and exertional dyspnea. Allergies on file were reviewed. BP in office 115/60 mmHg. EKG reveals sinus rhythm with RBBB at 78 bpm. No further questions or concerns at this time. I do not see any reason she cannot proceed with the procedure as planned. She has arranged transportation.  - Sara Mahoney is planning to complete pre procedure labs following today's visit.    Utilizing a shared joint decision process, we have agreed to proceed with coronary angiography and possible percutaneous coronary intervention. This risks, benefits, and alternatives to this approach were discussed at length. The risks, which include, but are not limited to death, MI, stroke, emergent cardiovascular surgery, renal failure, dialysis, vascular complications, bleeding, blood transfusions, and infection were discussed. The patient verbalized understanding and stated the desire to proceed. All questions were answered.    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  - Patient appears euvolemic on exam today.  - BP well controlled in office today: 115/60 mmHg  - Sara Mahoney endorses recurrent UTI since August that has been treated with two different antibiotics. Will have her discontinue Jardiance .     #Hyperlipidemia  Last Lipid Profile Cholesterol   Date Value Ref Range Status   12/01/2023 103 <200 mg/dL Final     HDL   Date Value Ref Range Status   12/01/2023 39 (L) >40 mg/dL Final     LDL   Date Value Ref Range Status   12/01/2023 53 <100 mg/dL Final     Triglycerides   Date Value Ref Range Status   12/01/2023 139 <150 mg/dL Final      - Goal LDL < 55 in setting of multivessel atherosclerotic disease  - Well controlled on atorvastatin  80 mg daily and Zetia  10 mg daily.  - Continue to monitor.    Follow Up Visit:  Follow up post procedure as indicated and with Dr. Lazarus as scheduled 12/13/24     I have personally documented the HPI, exam and medical decision making. I have instructed the patient on the plan of care and they verbalize understanding of the plan. Please see AVS for full patient teaching. Patient advised to call our office or message us  via My Chart if s/he has any problems, questions, worsening symptoms, or concerns prior to the next appointment.        Thank you for allowing me to participate in the care of this patient. If you have any questions please do not hesitate to contact our office.     This note was in part completed with Dragon, a speech recognition software.  Some grammatical and transcription errors may have occurred.  If you have any concern, please contact the the our office for clarification.     Sara Shove, FNP, APRN, FNP-BC  Collaborating Physician: Dr. JINNY. Kvapil/ Dr. DOROTHA Ned  Cardiovascular Medicine at [facility]  Phone: [phone]           Current Medications (including today's revisions)   aspirin  EC 81 mg tablet Take 1 tablet by mouth daily. Take with food.    atorvastatin  (LIPITOR) 80 mg tablet Take one tablet by mouth daily.    carvediloL  (COREG ) 3.125 mg tablet Take one tablet by mouth twice daily with meals. Take with food.    clopiDOGreL (PLAVIX) 75 mg tablet Take one tablet by mouth daily.    ezetimibe  (ZETIA ) 10 mg tablet Take one tablet by mouth daily.    metFORMIN  (GLUCOPHAGE ) 500 mg tablet Take one tablet by mouth twice daily after meals.    Miscellaneous Medical Supply misc Please check your blood pressure twice a day    MOUNJARO 10 mg/0.5 mL injector PEN     multivit-min-iron -FA-lutein (CENTRUM SILVER WOMEN) 8 mg iron -400 mcg-300 mcg tab Take 1 tablet by mouth daily.    spironolactone (ALDACTONE) 25 mg tablet Take one-half tablet by mouth daily. Take with food.                 [1]   Social History  Tobacco Use   Smoking Status Never   Smokeless Tobacco Never

## 2024-10-01 ENCOUNTER — Ambulatory Visit: Admit: 2024-10-01 | Discharge: 2024-10-01 | Payer: PRIVATE HEALTH INSURANCE | Primary: Family

## 2024-10-01 ENCOUNTER — Encounter: Admit: 2024-10-01 | Discharge: 2024-10-01 | Payer: PRIVATE HEALTH INSURANCE | Primary: Family

## 2024-10-01 VITALS — BP 115/60 | HR 77 | Ht 64.0 in | Wt 152.0 lb

## 2024-10-01 DIAGNOSIS — Z951 Presence of aortocoronary bypass graft: Secondary | ICD-10-CM

## 2024-10-01 DIAGNOSIS — E782 Mixed hyperlipidemia: Secondary | ICD-10-CM

## 2024-10-01 DIAGNOSIS — I2510 Atherosclerotic heart disease of native coronary artery without angina pectoris: Principal | ICD-10-CM

## 2024-10-01 DIAGNOSIS — Z136 Encounter for screening for cardiovascular disorders: Secondary | ICD-10-CM

## 2024-10-01 DIAGNOSIS — I10 Essential (primary) hypertension: Secondary | ICD-10-CM

## 2024-10-01 NOTE — Patient Instructions
 Follow-Up:    -Thank you for allowing us  to participate in your care today. Your After Visit Summary is being completed by Therisa LULLA Shove, APRN-NP.    -We would like you to follow up with Dr. Lazarus 12/13/24  -The schedule is released approximately 4-5 months in advance. You will be called by our scheduling department to make an appointment and you will also receive a notification via MyChart to self-schedule.  However, if you would like to call to make this appointment, please call [phone].    Changes From Today's Office Visit   Stop Jardiance  due to recurrent UTI.  Continue to monitor BP/HR at home and notify us  for persistently low or elevated readings. Goal BP range 90/60 - 130/80 mmHg. Goal HR range 60-100 bpm.    Cardiovascular Health Maintenance:  Avoid tobacco use or secondhand smoke exposure  Recommend 150 minutes/week of aerobic exercise attaining 65 to 75% of max heart rate (220 bpm minus current age)  Moderate alcohol intake; Men: Less than or equal to 2 drinks daily, Women: less than or equal to 1 drink daily  Reduced intake of dietary sodium, optimal goal less than 1500 mg daily  Maintenance of a healthy diet such as the [employer]    Contacting our office:    -Business Hours: Monday-Friday, 8:00 am-4:30 pm (excluding Holidays).     -For medical questions or concerns, please send us  a message through your MyChart account or call the [facility] team nursing triage line at [phone]. Please leave a detailed message with your name, date of birth, and reason for your call.  If your message is received before 3:30pm, every effort will be made to call you back the same day.  Please allow time for us  to review your chart prior to call back.     -For medication refills please start by contacting your pharmacy. You can also send us  a prescription question through your MyChart or call the nurse triage line above.     -Please allow a minimum of 10-14 business days for clearance from your cardiologist for procedures and/or FMLA, disability, or  DOT forms. An office visit or testing may be required for us  to provide clearance.     -[facility] team fax number: [phone]    -You may receive a survey in the upcoming weeks from [facility]. Your feedback is important to us  and helps us  continue to improve patient care and patient satisfaction.     -Please feel free to call our Financial Department at [phone] with any questions or concerns about estimated cost of testing or imaging ordered today. We are happy to provide CPT codes upon request.    Results & Testing Follow Up:    -Please allow 5-7 business days for the results of any testing to be reviewed. Please call our office if you have not heard from a nurse within this time frame.    -Should you choose to complete testing at an outside facility, please contact our office after completion of testing so that we can ensure that we have received results for your provider to review.    Lab and test results:  As a part of the CARES act, starting 03/27/2020, some results will be released to you via MyChart immediately and automatically.  You may see results before your provider sees them; however, your provider will review all these results and then they, or one of their team, will notify you of result information and recommendations.  Critical results will be addressed immediately, but otherwise, please allow us  time to get back with you prior to you reaching out to us  for questions.  This will usually take about 72 hours for labs and 5-7 days for procedure test results.

## 2024-10-10 ENCOUNTER — Encounter: Admit: 2024-10-10 | Discharge: 2024-10-10 | Payer: PRIVATE HEALTH INSURANCE | Primary: Family

## 2024-10-15 ENCOUNTER — Encounter: Admit: 2024-10-15 | Discharge: 2024-10-15 | Payer: PRIVATE HEALTH INSURANCE | Primary: Family

## 2024-10-26 ENCOUNTER — Encounter: Admit: 2024-10-26 | Discharge: 2024-10-26 | Payer: PRIVATE HEALTH INSURANCE | Primary: Family

## 2024-11-14 ENCOUNTER — Encounter: Admit: 2024-11-14 | Discharge: 2024-11-14 | Payer: PRIVATE HEALTH INSURANCE | Primary: Family

## 2024-11-20 ENCOUNTER — Encounter: Admit: 2024-11-20 | Discharge: 2024-11-20 | Payer: PRIVATE HEALTH INSURANCE | Primary: Family

## 2024-11-27 ENCOUNTER — Encounter: Admit: 2024-11-27 | Discharge: 2024-11-27 | Payer: PRIVATE HEALTH INSURANCE | Primary: Family

## 2024-11-28 ENCOUNTER — Encounter: Admit: 2024-11-28 | Discharge: 2024-11-28 | Payer: PRIVATE HEALTH INSURANCE | Primary: Family

## 2024-11-28 ENCOUNTER — Observation Stay
Admission: EM | Admit: 2024-11-28 | Discharge: 2024-11-28 | Disposition: A | Discharge status: Home / Self Care | Payer: PRIVATE HEALTH INSURANCE | Admitting: Student in an Organized Health Care Education/Training Program | Primary: Family

## 2024-11-28 ENCOUNTER — Inpatient Hospital Stay: Admit: 2024-11-28 | Discharge: 2024-11-29 | Payer: PRIVATE HEALTH INSURANCE | Primary: Family

## 2024-11-28 ENCOUNTER — Inpatient Hospital Stay: Admit: 2024-11-28 | Discharge: 2024-11-28 | Payer: PRIVATE HEALTH INSURANCE | Primary: Family

## 2024-11-28 MED FILL — PANTOPRAZOLE 40 MG PO TBEC: 40 mg | ORAL | 30 days supply | Qty: 60 | Fill #0 | Status: CP

## 2024-11-28 MED FILL — METOPROLOL SUCCINATE 25 MG PO TB24: 25 mg | ORAL | 30 days supply | Qty: 30 | Fill #0 | Status: CP

## 2024-11-28 NOTE — Progress Notes [1]
 Ambulatory (External) Cardiac Monitor Enrollment Record     Placement Location: Inpatient  Vendor: [employer] ([employer])  Mobile Cardiac Telemetry (MCOT/MCT)?: No  Duration of Monitor (in days): 14  Monitor Diagnosis: Palpitations (R00.2)  No data recordedOrdering Provider: NANCEY RONAL BROCKS [phone])  AMB Monitor Serial Number: [id number]  No data recordedNo data recordedNo data recorded    Start Time and Date: 11/28/24 4:54 PM   Patient Name: Sara Mahoney  DOB: [birth year]  MRN: [id number]  Sex: female  Mobile Phone Number: [phone] (mobile)  Home Phone Number: [phone]  Patient Address: [address] [place] [address]  Insurance Coverage: [employer] PPO/EPO  Insurance ID: [id number]  Insurance Group #: [id number]  Insurance Subscriber: [name],Sara Mahoney  Implanted Cardiac Device Information: No results found for: [email or web address]      Patient instructed to contact company phone number on the monitor box with questions regarding billing, placement, troubleshooting.     Sara Mahoney    ____________________________________________________________    Clinic Staff:    Complete additional steps for documentation double check/Co-Sign.  In Follow-up, send chart upon closing encounter to [facility] AMBULATORY MONITORS    HR[facility] Ambulatory Monitoring Team:  Schedule on appropriate template and check-in.   Clinic Placement Schedule on clinic location [facility] schedule   Home Enrollment Schedule on Home Enrollment schedule ([facility])   Given to patient in clinic for self-placement Schedule on Home Enrollment schedule ([facility])   Inpatient Schedule on [place] [facility] AMBULATORY MONITORING template   2. Please enroll with appropriate vendor.

## 2024-11-29 ENCOUNTER — Encounter: Admit: 2024-11-29 | Discharge: 2024-11-29 | Payer: PRIVATE HEALTH INSURANCE | Primary: Family

## 2024-11-29 ENCOUNTER — Ambulatory Visit: Admit: 2024-11-29 | Discharge: 2024-11-30 | Payer: PRIVATE HEALTH INSURANCE | Primary: Family

## 2024-11-29 VITALS — BP 115/74 | HR 81 | Temp 97.30000°F | Ht 64.0 in | Wt 142.6 lb

## 2024-11-29 MED ORDER — ESTRADIOL 0.01 % (0.1 MG/GRAM) VA CREA
1 g | VAGINAL | 3 refills | 30.00000 days | Status: AC
Start: 2024-11-29 — End: ?

## 2024-11-29 MED ORDER — METHENAMINE HIPPURATE 1 GRAM PO TAB
1 g | ORAL_TABLET | Freq: Two times a day (BID) | ORAL | 3 refills | 90.00000 days | Status: AC
Start: 2024-11-29 — End: ?

## 2024-11-29 MED ORDER — FOSFOMYCIN TROMETHAMINE 3 GRAM PO PACK
3 g | PACK | ORAL | 0 refills | 1.00000 days | Status: AC
Start: 2024-11-29 — End: ?

## 2024-11-29 NOTE — Progress Notes [1]
PVR 12 ml

## 2024-11-29 NOTE — Progress Notes [1]
 Date of Service: 11/29/2024     Subjective:               History of Present Illness  Sara Mahoney is a 63 year old female with recurrent urinary tract infections    She has been experiencing urinary tract infection symptoms since the first week of September, including burning, frequency, urgency, and nocturia. Despite completing six rounds of antibiotics, she continues to experience symptoms. Urine cultures consistently show E. coli resistant to Bactrim , Levaquin, and cephalexin. Patient showed me her culture results with outside provider via phone.     She denies gross hematuria. She just completed antibiotics yesterday and continues to have symptoms. Hasn't tried fosfomycin in the past.     She experiences constipation occasionally.   She drinks at least seventy ounces of water daily and is not currently sexually active.    Taking cranberry tablets.         Past Medical History:    Adverse drug reaction    Allergy    Anxiety disorder    CAD (coronary artery disease)    Cataract    Coronary atherosclerosis    Depression    Diabetes mellitus type II, uncontrolled    Edema    GERD (gastroesophageal reflux disease)    History of blood transfusion    HLD (hyperlipidemia)    HX Wrist fracture (right)    Hypertension    Hypertensive heart disease    Hypertriglyceridemia    Neuromuscular disorder (CMS-HCC)    Obesity    Postmenopausal    Small vessel vasculitis    Unspecified deficiency anemia       Surgical History:   Procedure Laterality Date    HX WISDOM TEETH EXTRACTION  1992    HX TUBAL LIGATION  1992    HX GASTRIC BYPASS  2017    (Cath) ANGIOGRAM, CORONARY, INCLUDING BYPASS GRAFT, WITH LEFT HEART CATHETERIZATION, IF INDICATED N/A 10/10/2024    Performed by Vertie Amelie BROCKS, MD at [facility] CATH LAB    PERCUTANEOUS CORONARY STENT PLACEMENT WITH ANGIOPLASTY N/A 10/10/2024    Performed by Vertie Amelie BROCKS, MD at [facility] CATH LAB    CARDIOVASCULAR STRESS TEST      DOPPLER ECHOCARDIOGRAPHY      HX BREAST REDUCTION HX CESAREAN SECTION      HX CHOLECYSTECTOMY      HX CORONARY ARTERY BYPASS GRAFT      x5    HX TUBAL LIGATION  1992       Family History   Problem Relation Name Age of Onset    Heart Attack Father      Early Death Father      Heart Disease Father      Heart Attack Sister      Diabetes Sister      Heart Disease Sister      Hypertension Sister      Arthritis Mother      Diabetes Mother      Hypertension Mother      Hearing Loss Mother      Heart Disease Mother      Osteoporosis Mother      Alcohol abuse Brother      Depression Brother      Drug Abuse Brother      Mental Illness Brother      Arthritis Maternal Grandmother      Hypertension Maternal Grandmother      Stroke Maternal Grandfather      Cancer Paternal Grandmother  Early Death Paternal Grandmother      Melanoma Neg Hx      Coronary Artery Disease Father Richard Pent     Sudden Cardiac Death Father Charlie [place]         died age 23    Coronary Artery Disease Sister Mercy Gaskins     Premature Heart Disease Sister Mercy Gaskins     High Cholesterol Sister Mercy Gaskins     Sudden Cardiac Death Sister Mercy Gaskins     High Cholesterol Mother Romero Bristol     Alcohol abuse Brother      Depression Brother      Drug Abuse Brother      Mental Illness Brother      Arthritis Mother      Diabetes Mother      Early Death Mother      Hearing Loss Mother      Heart Disease Mother      High Cholesterol Mother      Hypertension Mother      Arthritis Maternal Grandmother      Hypertension Maternal Grandmother      Cancer Paternal Grandmother      Early Death Paternal Grandmother      Diabetes Sister      Heart Disease Sister      High Cholesterol Sister      Hypertension Sister      Heart Disease Father Richard Pent     Stroke Maternal Grandfather      Early Death Father      Heart Disease Father         Current Medications[1]    Allergies[2]    Social History     Socioeconomic History    Marital status: Married    Number of children: 4   Occupational History     Employer: [employer]     Employer: [employer]   Tobacco Use    Smoking status: Never    Smokeless tobacco: Never   Vaping Use    Vaping status: Never Used   Substance and Sexual Activity    Alcohol use: No    Drug use: Never    Sexual activity: Not Currently     Partners: Male     Birth control/protection: Surgical, Post-menopausal         Objective:          aspirin  EC 81 mg tablet Take 1 tablet by mouth daily. Take with food.    atorvastatin  (LIPITOR) 80 mg tablet Take one tablet by mouth daily.    busPIRone (BUSPAR) 5 mg tablet Take one tablet by mouth twice daily.    clopiDOGreL (PLAVIX) 75 mg tablet Take one tablet by mouth daily.    DOCUSATE SODIUM PO Take 3 tablets by mouth daily.    ezetimibe  (ZETIA ) 10 mg tablet Take one tablet by mouth daily.    famotidine -Ca Carb-Mag Hydrox (PEPCID  COMPLETE) 10-800-165 mg tablet Chew one tablet by mouth daily.    metFORMIN  (GLUCOPHAGE ) 500 mg tablet Take one tablet by mouth twice daily after meals.    metoprolol  succinate XL (TOPROL  XL) 25 mg extended release tablet Take one tablet by mouth daily. Indications: chronic heart failure    Miscellaneous Medical Supply misc Please check your blood pressure twice a day    multivit-min/iron /folic acid/K (BARIATRIC MULTIVITAMINS PO) Take 1 tablet by mouth daily.    pantoprazole  DR (PROTONIX ) 40 mg tablet Take one tablet by mouth twice daily. Indications: heartburn  ranolazine ER (RANEXA) 500 mg tablet Take one tablet by mouth twice daily.    spironolactone (ALDACTONE) 25 mg tablet Take one-half tablet by mouth daily. Take with food.    tirzepatide (MOUNJARO) 12.5 mg/0.5 mL injector PEN Inject 0.5 mL under the skin every 7 days.     Vitals:    11/29/24 1442   BP: 115/74   BP Source: Arm, Right Upper   Pulse: 81   Temp: 36.3 ?C (97.3 ?F)   SpO2: 100%   PainSc: Two   Weight: 64.7 kg (142 lb 9.6 oz)   Height: 162.6 cm (5' 4)     Body mass index is 24.48 kg/m?.     Gen: well-developed, NAD  HEENT: normocephalic, conjunctiva clear, mucous membranes moist  Respiratory: normal respiratory effort with symmetric chest expansion  CV: Palpable radial pulse, no cyanosis  GI: Soft, non tender, non distended, no palpable masses  Skin: Warm, No jaundice  Musculoskeletal: No edema, no gross deformity  Neuro: Awake, alert, no focal deficits, normal gait  Psychiatric: Normal affect    GU:  Deferred       Results  LABS    Urine culture (11/28/2024): E. coli resistant to Bactrim , Levaquin, and cephalexin    Urine culture (11/13/2024): E. coli    Urine culture (10/30/2024): E. coli       PVR 12 mL  Assessment & Plan  Recurrent urinary tract infections  - Prescribed fosfomycin, three packets every 72 hours for three days.  - Initiated vaginal estrogen cream: nightly for two weeks, then three times a week.  - Recommended cranberry tablets, D-mannose, and vitamin C  for prevention.  - Discussed methenamine  as an additional preventive measure, to be taken with vitamin C  twice daily. She would like to start this.   - Scheduled follow-up in two months. Plan for pelvic during this visit.   - Advised to contact if no improvement with fosfomycin for potential urine testing.                    Jacquline JULIANNA Barrio, PA-C  Urology      Orders Placed This Encounter    POC Urine Dipstick Manual Read                 [1]   Current Outpatient Medications   Medication Sig Dispense Refill    aspirin  EC 81 mg tablet Take 1 tablet by mouth daily. Take with food. 90 tablet 3    atorvastatin  (LIPITOR) 80 mg tablet Take one tablet by mouth daily. 90 tablet 1    busPIRone (BUSPAR) 5 mg tablet Take one tablet by mouth twice daily.      clopiDOGreL (PLAVIX) 75 mg tablet Take one tablet by mouth daily.      DOCUSATE SODIUM PO Take 3 tablets by mouth daily.      ezetimibe  (ZETIA ) 10 mg tablet Take one tablet by mouth daily. 90 tablet 1    famotidine -Ca Carb-Mag Hydrox (PEPCID  COMPLETE) 10-800-165 mg tablet Chew one tablet by mouth daily.      metFORMIN  (GLUCOPHAGE ) 500 mg tablet Take one tablet by mouth twice daily after meals.      metoprolol  succinate XL (TOPROL  XL) 25 mg extended release tablet Take one tablet by mouth daily. Indications: chronic heart failure 90 tablet 0    Miscellaneous Medical Supply misc Please check your blood pressure twice a day 1 Each 0    multivit-min/iron /folic acid/K (BARIATRIC MULTIVITAMINS PO) Take 1 tablet by mouth  daily.      pantoprazole  DR (PROTONIX ) 40 mg tablet Take one tablet by mouth twice daily. Indications: heartburn 180 tablet 0    ranolazine ER (RANEXA) 500 mg tablet Take one tablet by mouth twice daily.      spironolactone (ALDACTONE) 25 mg tablet Take one-half tablet by mouth daily. Take with food. 45 tablet 3    tirzepatide (MOUNJARO) 12.5 mg/0.5 mL injector PEN Inject 0.5 mL under the skin every 7 days.       No current facility-administered medications for this visit.   [2]   Allergies  Allergen Reactions    Hazelnut HIVES    Levaquin [Levofloxacin] HIVES     Rash all over body.    Lisinopril COUGH    Nsaids (Non-Steroidal Anti-Inflammatory Drug) SEE COMMENTS     Hx of gastric bypass. Not supposed to take NSAIDs

## 2024-11-30 ENCOUNTER — Encounter: Admit: 2024-11-30 | Discharge: 2024-11-30 | Payer: PRIVATE HEALTH INSURANCE | Primary: Family

## 2024-11-30 DIAGNOSIS — N390 Urinary tract infection, site not specified: Secondary | ICD-10-CM

## 2024-11-30 DIAGNOSIS — R399 Unspecified symptoms and signs involving the genitourinary system: Principal | ICD-10-CM

## 2024-12-02 ENCOUNTER — Encounter: Admit: 2024-12-02 | Discharge: 2024-12-02 | Payer: PRIVATE HEALTH INSURANCE | Primary: Family

## 2024-12-12 ENCOUNTER — Encounter: Admit: 2024-12-12 | Discharge: 2024-12-12 | Payer: PRIVATE HEALTH INSURANCE | Primary: Family

## 2024-12-13 ENCOUNTER — Encounter: Admit: 2024-12-13 | Discharge: 2024-12-13 | Payer: PRIVATE HEALTH INSURANCE | Primary: Family

## 2024-12-13 ENCOUNTER — Ambulatory Visit: Admit: 2024-12-13 | Discharge: 2024-12-14 | Payer: PRIVATE HEALTH INSURANCE | Primary: Family

## 2024-12-13 VITALS — BP 104/80 | HR 85 | Ht 64.0 in | Wt 143.1 lb

## 2024-12-13 DIAGNOSIS — I2510 Atherosclerotic heart disease of native coronary artery without angina pectoris: Principal | ICD-10-CM

## 2024-12-13 DIAGNOSIS — I429 Cardiomyopathy, unspecified: Secondary | ICD-10-CM

## 2024-12-13 MED ORDER — VALSARTAN 40 MG PO TAB
40 mg | ORAL_TABLET | Freq: Every day | ORAL | 3 refills | 60.00000 days | Status: AC
Start: 2024-12-13 — End: ?

## 2024-12-13 MED ORDER — FUROSEMIDE 20 MG PO TAB
20 mg | ORAL_TABLET | ORAL | 1 refills | 90.00000 days | Status: AC | PRN
Start: 2024-12-13 — End: ?

## 2024-12-13 NOTE — Progress Notes [1]
 Date of Service: 12/13/2024    Sara Mahoney is a 63 y.o. female.       HPI     I wanted to send you an update on your patient, Sara Mahoney, who I saw for follow-up with the Cardiology Department, with the [facility].    As you know, Sara Mahoney is a very pleasant 63 year old female with a history of early onset coronary artery disease status post CABG with a LIMA to LAD, SVG to D1 and OM1, and SVG to PLV and PDA (at the age of 61) on December 24, 2008, she was noted to have silent ischemia, she did have postop atrial fibrillation after bypass surgery, though we have not seen atrial fibrillation since then, right bundle branch block, hypertension, dyslipidemia, diabetes, depression, anxiety, elevated BMI, nonproliferative diabetic retinopathy, prior diabetic foot infection, peripheral vascular disease, and prior palpitations.  Unfortunately she sustained a non-STEMI in August 2025, she was treated at [facility] in [place] , she was not felt to have targets for  revascularization, her EF dropped to 40%, she was treated medically.     I more recently saw her during her hospitalization at the [facility] where she was admitted for progressive palpitations.     She completed a myocardial perfusion study on August 20, 2024 at [facility] in [place] , it was nondiagnostic due to GI uptake, there was global hypokinesis, the EF was 41%, the end-diastolic volume was 107 mL, overall the perfusion imaging was felt to be nondiagnostic.  The last left heart catheterization was on October 10, 2024, the LVEDP was 8 mmHg, the left main had mild to moderate plaquing, the LAD had a proximal 90% stenosis, there is a small first diagonal branch with severe disease, the left circumflex had an OM1 with a CTO, the OM 2 had irregularities, the RCA had a mid CTO, the LIMA to the diagonal branch was patent, the SVG to OM1 was patent, the SVG to RCA had a CTO.  At that time medical therapy was continued, consideration of a PCI to the CTO to the RCA could be considered in the future.  Her echocardiogram on November 28, 2024, it showed EF of 35 to 40%, the end-diastolic dimension was 5.5 cm, end-diastolic volume index was 87 mL/m?, the RV was mildly dilated with preserved function, mild to moderate MR, trace TR, aortic valve sclerosis without stenosis, trace AI, aorta is normal in size, no pericardial effusion, right atrial pressure 3 mmHg, peak pulmonary pressures unable to be calculated.    Sara Mahoney comes in today for follow-up.  We had converted her carvedilol  to Toprol -XL to try and help with the palpitations.  She has completed a Zio patch monitor, though she has not mailed it back in yet.  I will send an update once I get the results.    She had an episode earlier in the week where she had some more prominent fluid retention, with a subtle amount of pressure chest pressure, it ended up resolving on its own.  I am not sure if she may have had something dietary wise lead to that, though she looks stable from a volume standpoint today.    She is not having progressive chest pain.  She is not having orthopnea or PND.  She is not having syncope.  She is not having TIA or stroke type symptoms.     Objective   Vitals:    12/13/24 0827   BP: 104/80  BP Source: Arm, Left Upper   Pulse: 85   SpO2: 99%   O2 Device: None (Room air)   PainSc: Zero   Weight: 64.9 kg (143 lb 1.6 oz)   Height: 162.6 cm (5' 4)     Body mass index is 24.56 kg/m?SABRA     Past Medical History  Patient Active Problem List    Diagnosis Date Noted    Coronary artery disease involving native coronary artery of native heart without angina pectoris 12/24/2008     Priority: High     A.  10/16/2008 Ex Echo EF 60%, RV size and function normal, no significant valvular abnormalities, exercised 6 minutes and 30 seconds, achieved a peak heart rate of 172 which is 98% of the maximum predicted heart rate, there were ischemic changes on the ECG, the exercise echocardiogram showed LAD and RCA ischemia.  B.  12/24/2008 LHC LVEDP 10 to 14 mmHg, left main normal, LAD 60 to 70% stenosis followed by 95% stenosis, D1 had 95% stenosis, left circumflex had an OM1 with a 60 to 70% stenosis, the RCA had a mid 50% stenosis, the PDA had a 95% stenosis, followed by 60% stenosis, the PLV had a 95% stenosis.  C.  12/24/2008 CABG with Dr Tess  with a LIMA to LAD, SVG to D1 and OM1, and SVG to PLV and PDA   D.  01/15/2010 Ex Echo EF 55%, RV function normal, valvular structures not well-seen, exercised 7 minutes and 10 seconds, achieved a peak heart rate of 156 which was 90% of the maximum predicted heart rate, no evidence of ischemia.  E.  10/13/2011 Ex Echo EF 60%, RV size and function normal, trace MR, exercised 6 minutes and 18 seconds, achieved a heart rate of 122 which was 70% of the maximum predicted heart rate, PVCs noted, no evidence of ischemia at the workload achieved.  Overall, it was submaximal.  F.  02/15/2014 Echo EF 60%, RV size and function normal, trace MR, aorta normal in size, no pericardial effusion.  G.  05/09/2015 MPI EF 67%, EDV 66 mL, pulmonary to myocardial count ratio 0.36, no transient ischemic dilatation, no evidence of ischemia.  Overall considered low risk.  H.  07/15/2017 MPI EF 67%, EDV 54 mL, pulmonary to myocardial count ratio 0.25, no transient ischemic dilatation, no evidence of ischemia.  Overall considered low risk.  I.    09/14/2023 MPI EF 71%, EDV 91 mL, the perfusion pattern showed a small fixed defect involving the apex felt to be due to soft tissue attenuation, definite ischemic change was not noted.  J.  10/11/2023 Echo EF 55 to 60%, diastolic function normal, RV size and function normal, nonspecific mitral valve thickening with mild MR, trace TR, aortic valve sclerosis without stenosis, trace AI, the aortic root was 3.2 cm, ascending aorta is 3.5 cm, there is no pericardial effusion, the right atrial pressure is 3 mmHg, the peak pulmonary pressures 30 mmHg.  K.  08/20/2024 Echo at [facility] in [place]  EF 40%, there is mild diastolic dysfunction, there is diffuse hypokinesis, the RV size and function appeared normal, there is aortic valve sclerosis without stenosis, there was moderate MR, trace TR, aortic root was normal in size, there is no pericardial effusion.    L.  08/20/2024 MPI at [facility] in [place]  EF 41%, the end-diastolic volume was 107 mL, overall the perfusion imaging was felt to be nondiagnostic.    M.  08/21/2024 LHC at [facility] in [place]   he left main was patent, the LAD had a CTO, the left circumflex had severe disease, the RCA had a CTO, the LIMA to LAD was patent, the SVG to OM was patent, the SVG to RCA had a CTO.  The abdominal aortogram and distal runoff showed severe calcification of the right femoral artery, the renal arteries were patent, the distal aorta is mildly diseased, patent bilateral external and common iliac arteries, moderate disease in the right common femoral artery mild to moderate disease in the left common femoral artery.  She was treated medically.    N.  10/10/2024 LHC the LVEDP was 8 mmHg, the left main had mild to moderate plaquing, the LAD had a proximal 90% stenosis, there is a small first diagonal branch with severe disease, the left circumflex had an OM1 with a CTO, the OM 2 had irregularities, the RCA had a mid CTO, the LIMA to the diagonal branch was patent, the SVG to OM1 was patent, the SVG to RCA had a CTO.  At that time medical therapy was continued, consideration of a PCI to the CTO to the RCA could be considered in the future.    O.  11/28/2024 Echo EF 35-40%, the end-diastolic dimension was 5.5 cm, end-diastolic volume index was 87 mL/m?, the RV was mildly dilated with preserved function, mild to moderate MR, trace TR, aortic valve sclerosis without stenosis, trace AI, aorta is normal in size, no pericardial effusion, right atrial pressure 3 mmHg, peak pulmonary pressures unable to be calculated.      Chest pain 11/27/2024    Obesity 06/09/2021    Peripheral arterial occlusive disease 06/09/2021     04/07/15 - Resting ABI: Mildly reduced ABI's bilaterally. Right: moderately reduced TBI. Left: mildly reduced TBI  07/18/17 - BLE Venous US : No evidence of acute femoral/popliteal DVT in the bilateral lower extremities.   03/12/21 - RLE venous US  ([facility]ia [employer]): No evidence of right lower extremity DVT. RLE deep venous system shows normal compressibility with normal response to augmentation and valsalva. No fluid collection or mass detected.   04/03/21  - Ankle Brachial Index ([facility]ia [facility]): Right: normal ABI. Mildly abnormal TBI. Left: Normal ABI & TBI.         PAC (premature atrial contraction) 08/22/2017    Palpitations 07/19/2017     08/13/17 - Event Monitor (14 days): Normal sinus rhythm.  PACs rare. No evidence of atrial fibrillation. 1 event was transmitted with symptoms of dizziness did not correlate with any arrhythmias. Intermittent artifact noted as the patient was shaking or using something that is shaking.  However QRS complexes can be seen much in the right through the artifact.      Diabetic foot infection (CMS-HCC) 04/05/2015     04/07/15 ABI's:  Mildly reduced ABI's bilaterally.  Right TBI moderately reduced and left TBI mildly reduced.        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 She describes worrying/anxiety cycles and she indicates symptoms of social phobia  10/11/13 Restarted Wellbutrin       Overweight 11/06/2009    Postoperative atrial fibrillation (CMS-HCC) 12/26/2008     S/p CABG x5 (12/26/2008).  S/P CABG x 5 12/26/2008     12/26/2008 - CABG x5 (Dr Tess)      HLD (hyperlipidemia) 12/24/2008             Depression 12/24/2008    Diabetes mellitus, type II (CMS-HCC) 02/07/2008     Medications  Linagliptin-metformin  2.5-1000mg  BID, glimepiride 2mg  QDay   ACE/ARB: Losartan  50mg  QDay    Dilated Eye Exam (q1y):   08/2014 Signs of nonproliferative diabetic retinopathy     Foot Exam (q1y):   09/04/14   Right foot: 6/10  Left foot: 5/10    Dental Exam (q13m):   Performs regularly    Labs   Last A1c (q77m):   Lab Results   Component Value Date    HGBA1C 10.8 06/26/2010    HGBA1C 8.8 12/24/2008    HGBA1C 7.5 11/12/2008    A1C 7.5 03/24/2015    A1C 9.4 09/04/2014    A1C 7.6 02/06/2014    HGBPOC 9.5 12/25/2008    HGBPOC 9.2 12/25/2008    HGBPOC 8.8 12/25/2008       Microalbumin (q1y):   MICROALBUMIN/CR RATIO URINE   Date Value Ref Range Status   03/17/2015 12.50 <30 ug/mg Final     Comment:     NOTE NEW REFERENCE RANGES   10/19/2013 31.32 MG/GRAM Final   06/26/2010 9.93 MG/GRAM Final     MICROALBUMIN, RANDOM   Date Value Ref Range Status   03/17/2015 7.0 <19 MCG/ML Final   10/19/2013 21.3* <19 MCG/ML Final   06/26/2010 15.0 <19 MCG/ML Final        Lipid profile (q1y):   Lab Results   Component Value Date    CHOL 152 01/08/2014    TRIG 293* 01/08/2014    HDL 37* 01/08/2014    LDL 84 01/08/2014    VLDL 59 01/08/2014    NONHDLCHOL 115 01/08/2014        Immunizations:   Pneumovax/Prevnar: Pneumovax 11/2013  Flu: Receives annually at work  TDap: Not up to date  Hep B (15-59 yo): 09/04/14  Zoster ( 68 yo or older): Not indicated yet    BP: Controlled  Smoking: Denies    Aspirin  81mg     Atorvastatin  40mg  QDay      Primary hypertension 02/07/2008       Review of Systems   Constitutional: Negative.   HENT: Negative.     Eyes: Negative.    Cardiovascular: Negative.    Respiratory: Negative.     Endocrine: Negative.    Hematologic/Lymphatic: Negative.    Skin: Negative.    Musculoskeletal: Negative.    Gastrointestinal: Negative.    Genitourinary: Negative.    Neurological: Negative.    Psychiatric/Behavioral: Negative.     Allergic/Immunologic: Negative.        Physical Exam  General Appearance: Appears stated age, no acute distress   Neck Veins: neck veins are not distended   Chest Inspection: chest is normal in appearance, well-healed sternotomy scar  Respiratory Effort: breathing comfortably, no respiratory distress   Auscultation/Percussion: lungs clear to auscultation, no rales or rhonchi, no wheezing   Cardiac Rhythm: regular rhythm and normal rate   Cardiac Auscultation: S1, S2 normal, no rub, no gallop   Murmurs: 2/6 early peaking systolic murmur at the base  Peripheral Circulation: normal peripheral circulation   Carotid Arteries: no bruits   Radial Arteries: normal symmetric radial pulses   Abdominal Aorta: no abdominal aortic bruit   Pedal Pulses: normal symmetric pedal pulses   Lower Extremity  Edema: Trace lower extremity edema   Abdominal Exam: soft, non-tender, non-distended, normal bowel sounds   Affect & Mood: appropriate and sustained affect   Language and Memory: patient responsive and seems to comprehend information   Neurologic Exam: neurological assessment grossly intact   Other: moves all extremities    Cardiovascular Studies  No ECG was obtained today.    Cardiovascular Health Factors  Vitals BP Readings from Last 3 Encounters:   12/13/24 104/80   11/29/24 115/74   11/28/24 106/60     Wt Readings from Last 3 Encounters:   12/13/24 64.9 kg (143 lb 1.6 oz)   11/29/24 64.7 kg (142 lb 9.6 oz)   11/28/24 63.5 kg (140 lb)     BMI Readings from Last 3 Encounters:   12/13/24 24.56 kg/m?   11/29/24 24.48 kg/m?   11/28/24 24.80 kg/m?      Smoking Tobacco Use History[1]   Lipid Profile Cholesterol   Date Value Ref Range Status   11/28/2024 90 <200 mg/dL Final     HDL   Date Value Ref Range Status   11/28/2024 39 (L) >40 mg/dL Final     LDL   Date Value Ref Range Status   11/28/2024 37.91 <100.00 mg/dL Final     Triglycerides   Date Value Ref Range Status   11/28/2024 106 <150 mg/dL Final      Blood Sugar Hemoglobin A1C   Date Value Ref Range Status   11/28/2024 5.2 4.0 - 5.7 % Final     Comment:     The ADA recommends that most patients with type 1 and type 2 diabetes maintain an A1c level <7%.     Glucose   Date Value Ref Range Status   11/28/2024 86 70 - 100 mg/dL Final   89/93/7974 85 70 - 100 mg/dL Final   90/80/7974 889 (H)  Final     Glucose, POC   Date Value Ref Range Status   11/28/2024 118 (H) 70 - 100 mg/dL Final   87/96/7974 86 70 - 100 mg/dL Final   87/96/7974 80 70 - 100 mg/dL Final         Problems Addressed Today  Encounter Diagnoses   Name Primary?    Coronary artery disease involving native coronary artery of native heart without angina pectoris Yes    Cardiomyopathy, unspecified type (CMS-HCC)        Assessment and Plan     Early onset coronary artery disease status post CABG with a LIMA to LAD, SVG to D1 and OM1, and SVG to PLV and PDA at the age of 89 on 12/04/2008, with a recent non-STEMI on 08/20/2024, with a CTO of the SVG to RCA in the setting of an RCA CTO.  Follow-up angiogram on 10/10/2024 was similar and she was treated medically.  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  She has silent ischemia.  Postop atrial fibrillation after bypass surgery, she has not had recurrence of atrial fibrillation since then.  Right bundle branch block.  Hypertension.  Dyslipidemia.  Diabetes.  Nonproliferative diabetic retinopathy.  Prior diabetic foot infection.  Elevated BMI.  Depression.  Anxiety.  PVD.  Prior palpitations.    Ms. Inlow comes in today for follow-up.  She is still had some palpitations at times as we mentioned above.  Her Zio patch monitor has not been turned in yet, I would like to review that and decide on medical therapy moving forward.    Her blood pressure is  stable but on the softer side.  I do think there may be room for is a small ARB.  I would like to add valsartan  40 mg daily to her regimen of Toprol -XL 25 mg daily, and spironolactone 12.5 mg daily.    From an antianginal standpoint she is on Ranexa  500 mg twice daily.    She remains on DAPT from her non-STEMI in August.    Her last lipid panel showed an LDL of 82.  Will plan to follow-up on that moving forward, her goal LDL is less than 55.  She is currently on atorvastatin  80 mg daily, and Zetia  10 mg daily.    I would like to continue to maximize her medical therapy, eventually will plan to follow-up on her LV function after on maximal tolerated doses.    I will send an update on her Zio patch monitor once is complete.  Otherwise I would like to have her seen in 1 or 2 months for further titration of her GDMT.    Thank you for allowing me to participate in Ms. Gail's care.  Please feel free to contact me if I can be of further assistance.    Sincerely,    Elspeth Sinks, M.D.         Current Medications (including today's revisions)   aspirin  EC 81 mg tablet Take 1 tablet by mouth daily. Take with food.    atorvastatin  (LIPITOR) 80 mg tablet Take one tablet by mouth daily.    busPIRone (BUSPAR) 5 mg tablet Take one tablet by mouth twice daily.    clopiDOGreL (PLAVIX) 75 mg tablet Take one tablet by mouth daily.    DOCUSATE SODIUM PO Take 3 tablets by mouth daily.    estradioL  (ESTRACE ) 0.01 % (0.1 mg/g) vaginal cream Insert or Apply one g to vaginal area three times weekly.    ezetimibe  (ZETIA ) 10 mg tablet Take one tablet by mouth daily.    famotidine -Ca Carb-Mag Hydrox (PEPCID  COMPLETE) 10-800-165 mg tablet Chew one tablet by mouth daily.    fosfomycin  tromethamine  (MONUROL ) 3 gram packet Take one packet by mouth every 72 hours. Indications: genitourinary tract infections    furosemide  (LASIX ) 20 mg tablet Take one tablet by mouth as Needed. For weight gain or edema    metFORMIN  (GLUCOPHAGE ) 500 mg tablet Take one tablet by mouth twice daily after meals.    methenamine  hippurate (HIPREX ) 1 gram tablet Take one tablet by mouth twice daily.    metoprolol  succinate XL (TOPROL  XL) 25 mg extended release tablet Take one tablet by mouth daily. Indications: chronic heart failure    Miscellaneous Medical Supply misc Please check your blood pressure twice a day    multivit-min/iron /folic acid/K (BARIATRIC MULTIVITAMINS PO) Take 1 tablet by mouth daily.    pantoprazole  DR (PROTONIX ) 40 mg tablet Take one tablet by mouth twice daily. Indications: heartburn    ranolazine  ER (RANEXA ) 500 mg tablet Take one tablet by mouth twice daily.    spironolactone (ALDACTONE) 25 mg tablet Take one-half tablet by mouth daily. Take with food.    tirzepatide (MOUNJARO) 12.5 mg/0.5 mL injector PEN Inject 0.5 mL under the skin every 7 days.    valsartan  (DIOVAN ) 40 mg tablet Take one tablet by mouth daily.                 [1]   Social History  Tobacco Use   Smoking Status Never   Smokeless Tobacco Never

## 2024-12-13 NOTE — Patient Instructions [37]
 Follow up plan:    Please follow up with Madelin Deer, APRN_NP in 1-2 months schedule today. You can call scheduling at [phone] to make or change your appointment. Please schedule 1-2 months in advance (when possible) to assure a timely appointment.       Please follow up with Dr. Lazarus in 6-8 months, due June thru Aug 2026. You can call scheduling at [phone] to make or change your appointment. Please schedule 6-8 months in advance (when possible) to assure a timely appointment.     Please start valsartan  40mg  daily. A new script was sent to your RX.     Please start lasix  20mg  as needed daily for weight gain and/or edema. A new script was sent to your RX.      Please get a BMP/lab draw in 2 weeks as we need to recheck your kidney levels after medication changes. See lab locations below.    If you have any questions/concerns please call the [facility] Team, nurse's for Dr. Lazarus, at [phone] or send an email via MyChart. We attempt to responds to messages received by 3pm Monday thru Friday the same day.     Thank you for choosing us  for your cardiac care!  Greig Glatter, RN, BSN      LAB LOCATIONS:     [place]  [place], [place]   [facility]  [address], 1st floor, directly to the left of the Information Desk  [place]  [place], [place] [address]  At [facility] and [address] adjacent to [employer] [facility] [place]  [facility]  [phone]   7 a.m. - 6 p.m. Monday - Friday   7 a.m. - noon Saturday    [place]  [place], [place]    [facility]  [address]  [place], [place] [address]  [phone]   8 a.m. - 4:30 p.m. Monday - Friday    [facility], [place]   [facility]  [address]  [place], [place] [address]  [phone]   8 a.m. - 5 p.m. Monday - Friday     [facility], [place]   [facility] [place] [place] [facility] [facility]  [address]  [place], [place] [address]  [phone]   7 a.m. - 5:30 p.m. Monday - Friday   7 a.m. - noon Saturday - Sunday     [facility]  [address] [place], [place] [address]  [phone]   8 a.m. - 4:30 p.m. Monday - Friday    [facility]  [address] [place] [address]  [address]  [place], [place] [address]  [phone]   7 a.m.-6 p.m. Monday-Thursday   7 a.m.-4 p.m. Friday    [place], [place]   [facility]  [address]   [place], [place] [address]  [phone]   7 a.m.-5:30 p.m. Monday-Friday      Locations are closed all major holidays (excluding Veterans Day).   Location hours can change and we recommend you calling prior to your visit.     IF FASTING labs are needed, please only have water and/or black coffee until blood drawn the morning of the procedure. Please fast for a minimum of 8 hours.     Contacting our office:    -For NON-URGENT questions please contact us  via message through your MyChart account.   -For all medication refills please contact your pharmacy or send a request through MyChart.     -For all questions that may need to be addressed urgently please call the [facility]  nursing triage line at [phone] Monday - Friday 8am-5 pm only. Please leave a detailed message with your name, date of birth, and reason for your call.  If your message is received before 3:30pm, every effort will be made to call you back the same day.  Please allow time for us  to review your chart prior to call back.     -Should you have an urgent concern over the weekend/nights, the on-call triage line is [phone].    [facility] nursing team fax number: [phone]    -You may receive a survey in the upcoming weeks from [facility]. Your feedback is important to us  and helps us  continue to improve patient care and patient satisfaction.     -Please feel free to call our Financial Department at [phone] with any questions or concerns about estimated cost of testing or imaging ordered today. We are happy to provide CPT codes upon request.    Results & Testing Follow Up:    -Please allow 5-7 business days for the results of any testing to be reviewed. Please call our office if you have not heard from a nurse within this time frame.    -Should you choose to complete testing at an outside facility, please contact our office after completion of testing so that we can ensure that we have received results for your provider to review.    Lab and test results:  As a part of the CARES act, starting 03/27/2020, some results will be released to you via MyChart immediately and automatically.  You may see results before your provider sees them; however, your provider will review all these results and then they, or one of their team, will notify you of result information and recommendations.   Critical results will be addressed immediately, but otherwise, please allow us  time to get back with you prior to you reaching out to us  for questions.  This will usually take about 72 hours for labs and 5-7 days for procedure test results.    Cardiac Clearance:  Please allow a minimum of 10-14 business days for clearance from your cardiologist for procedures and/or FMLA, disability, or DOT forms. An office visit or testing may be required for us  to provide clearance.?

## 2024-12-18 ENCOUNTER — Encounter: Admit: 2024-12-18 | Discharge: 2024-12-18 | Payer: PRIVATE HEALTH INSURANCE | Primary: Family

## 2024-12-18 DIAGNOSIS — I2510 Atherosclerotic heart disease of native coronary artery without angina pectoris: Principal | ICD-10-CM

## 2024-12-18 DIAGNOSIS — Z951 Presence of aortocoronary bypass graft: Secondary | ICD-10-CM

## 2024-12-18 MED ORDER — RANOLAZINE 500 MG PO TB12
500 mg | ORAL_TABLET | Freq: Two times a day (BID) | ORAL | 2 refills | 30.00000 days | Status: AC
Start: 2024-12-18 — End: ?

## 2024-12-28 ENCOUNTER — Encounter: Admit: 2024-12-28 | Discharge: 2024-12-28 | Payer: PRIVATE HEALTH INSURANCE | Primary: Family

## 2024-12-28 NOTE — Telephone Encounter [36]
 Merilee No, RN  P [facility]    12/13/2024 9:10 AM  Valsartan  started today. Pt needs 2 week BMP. Done?

## 2024-12-29 ENCOUNTER — Ambulatory Visit: Admit: 2024-12-29 | Discharge: 2024-12-30 | Payer: PRIVATE HEALTH INSURANCE | Primary: Family

## 2024-12-29 ENCOUNTER — Encounter: Admit: 2024-12-29 | Discharge: 2024-12-29 | Payer: PRIVATE HEALTH INSURANCE | Primary: Family

## 2024-12-30 ENCOUNTER — Encounter: Admit: 2024-12-30 | Discharge: 2024-12-30 | Payer: PRIVATE HEALTH INSURANCE | Primary: Family

## 2024-12-30 MED FILL — METOPROLOL SUCCINATE 25 MG PO TB24: 25 mg | ORAL | 30 days supply | Qty: 30 | Fill #0 | Status: AC

## 2024-12-31 ENCOUNTER — Encounter: Admit: 2024-12-31 | Discharge: 2024-12-31 | Payer: PRIVATE HEALTH INSURANCE | Primary: Family

## 2024-12-31 MED FILL — PANTOPRAZOLE 40 MG PO TBEC: 40 mg | ORAL | 30 days supply | Qty: 60 | Fill #0 | Status: AC

## 2024-12-31 NOTE — Telephone Encounter [36]
 12/31/2024 11:17 AM   MCM sent     Bormann, Elspeth ORN, MD to [facility]  (Selected Message) 12/31/24  9:35 AM  Result Note  I think her labs look stable on her current cardiac regimen.  I do not think we need to make any changes at this time.     SWB

## 2025-01-02 ENCOUNTER — Encounter: Admit: 2025-01-02 | Discharge: 2025-01-02 | Payer: PRIVATE HEALTH INSURANCE | Primary: Family

## 2025-01-03 ENCOUNTER — Encounter: Admit: 2025-01-03 | Discharge: 2025-01-03 | Payer: PRIVATE HEALTH INSURANCE | Primary: Family

## 2025-01-03 NOTE — Telephone Encounter [36]
 01/03/2025 10:20 AM     Called to discuss results with pt, no answer. LVM    Lazarus Elspeth ORN, MD to [facility] (Selected Message) 01/03/25  6:21 AM    Can you let Ms. Sara Mahoney know I reviewed her ambulatory monitor.     The monitor was utilized from 11/28/2024 till 12/12/2024.  Basic underlying rhythm is normal sinus rhythm.  In the normal rhythm her heart rate varied tween 60 and 104 with an average of 76.  She had rare extra beats from the upper and the lower chambers.  She had 2 episodes where the heart rate sped up on its own.  These were very brief, the longest was 8 beats in duration, the longest episode had an average heart rate of 108.     Overall I think her ambulatory monitor is reassuring.  She does have some extra beats, though they are fairly rare.  The episodes of SVT are fairly rare and short in duration.     Can you see how she has been doing as of late. If she is still feeling the palpitations prominently, there is room to increase the Toprol  to try and help.     SWB     LONG-TERM CARDIAC MONITOR

## 2025-01-09 ENCOUNTER — Encounter: Admit: 2025-01-09 | Discharge: 2025-01-09 | Payer: PRIVATE HEALTH INSURANCE | Primary: Family

## 2025-01-17 ENCOUNTER — Encounter: Admit: 2025-01-17 | Discharge: 2025-01-17 | Payer: PRIVATE HEALTH INSURANCE | Primary: Family

## 2025-01-17 ENCOUNTER — Ambulatory Visit: Admit: 2025-01-17 | Discharge: 2025-01-18 | Payer: PRIVATE HEALTH INSURANCE | Primary: Family

## 2025-01-17 VITALS — BP 88/55 | HR 74 | Ht 64.0 in | Wt 135.0 lb

## 2025-01-17 DIAGNOSIS — I2510 Atherosclerotic heart disease of native coronary artery without angina pectoris: Principal | ICD-10-CM

## 2025-01-17 DIAGNOSIS — E782 Mixed hyperlipidemia: Secondary | ICD-10-CM

## 2025-01-17 DIAGNOSIS — R002 Palpitations: Secondary | ICD-10-CM

## 2025-01-17 MED ORDER — PANTOPRAZOLE 40 MG PO TBEC
40 mg | ORAL_TABLET | Freq: Two times a day (BID) | ORAL | 3 refills | 90.00000 days | Status: AC
Start: 2025-01-17 — End: ?

## 2025-01-17 MED ORDER — METOPROLOL SUCCINATE 25 MG PO TB24
25 mg | ORAL_TABLET | Freq: Every day | ORAL | 3 refills | 90.00000 days | Status: AC
Start: 2025-01-17 — End: ?

## 2025-01-17 MED ORDER — CLOPIDOGREL 75 MG PO TAB
75 mg | ORAL_TABLET | Freq: Every day | ORAL | 3 refills | 90.00000 days | Status: AC
Start: 2025-01-17 — End: ?

## 2025-01-17 MED ORDER — MOUNJARO 10 MG/0.5 ML SC PNIJ
10 mg | SUBCUTANEOUS | 3 refills | 28.00000 days | Status: AC
Start: 2025-01-17 — End: ?

## 2025-01-17 MED ORDER — EZETIMIBE 10 MG PO TAB
10 mg | ORAL_TABLET | Freq: Every day | ORAL | 3 refills | 90.00000 days | Status: AC
Start: 2025-01-17 — End: ?

## 2025-01-17 MED ORDER — SPIRONOLACTONE 25 MG PO TAB
12.5 mg | ORAL_TABLET | Freq: Every day | ORAL | 3 refills | 90.00000 days | Status: AC
Start: 2025-01-17 — End: ?

## 2025-01-17 NOTE — Progress Notes [1]
 Cardiovascular Medicine     Date of Service: 01/17/2025    HPI     Ms. Sara Mahoney is a very pleasant 64 year old female with a history of early onset coronary artery disease status post CABG with a LIMA to LAD, SVG to D1 and OM1, and SVG to PLV and PDA (at the age of 39) on December 24, 2008, she was noted to have silent ischemia, she did have postop atrial fibrillation after bypass surgery, though we have not seen atrial fibrillation since then, right bundle branch block, hypertension, dyslipidemia, diabetes, depression, anxiety, elevated BMI, nonproliferative diabetic retinopathy, prior diabetic foot infection, peripheral vascular disease, and prior palpitations.  Unfortunately she sustained a non-STEMI in August 2025, she was treated at [facility] in [facility] , she was not felt to have targets for  revascularization, her EF dropped to 40%, she was treated medically.     Sara Mahoney presents today in follow-up.  She is hypotensive, with a systolic of 88.  She does report more fatigue.  Some fluctuating blood pressures at home.  She also has occasional lightheadedness and dizziness.  She has been trying to increase her exercise as tolerated.  She does not report any angina or undue dyspnea.  Her exam is euvolemic.       Vitals:    01/17/25 0823   BP: (!) 88/55   BP Source: Arm, Left Upper   Pulse: 74   SpO2: 98%   O2 Device: None (Room air)   PainSc: Zero   Weight: 61.2 kg (135 lb)   Height: 162.6 cm (5' 4)     Body mass index is 23.17 kg/m?.     Cardiovascular Studies  Preliminary EKG from today:    Most recent results for 12-Lead ECG   ECG 12-LEAD    Collection Time: 11/27/24 11:31 PM   Result Value Status    VENTRICULAR RATE 79 Final    P-R INTERVAL 188 Final    QRS DURATION 142 Final    Q-T INTERVAL 438 Final    QTC CALCULATION (BAZETT) 502 Final    P AXIS 57 Final    R AXIS 91 Final    T AXIS -18 Final    Impression    Normal sinus rhythm  Right bundle branch block  Nondiagnostic Q waves in the inferior leads  Nonspecific ST abnormality  T wave abnormality, consider lateral ischemia  Abnormal ECG  Confirmed by Sara Mahoney ([id number]) on 11/28/2024 7:10:30 AM   Most recent results for 12-Lead ECG   [facility] MAIN ECG TRIAGE ONLY    Collection Time: 08/24/24  5:41 AM   Result Value Status    VENTRICULAR RATE 81 Final    P-R INTERVAL 182 Final    QRS DURATION 144 Final    Q-T INTERVAL 428 Final    QTC CALCULATION (BAZETT) 497 Final    P AXIS 36 Final    R AXIS 106 Final    T AXIS -22 Final    Impression    Normal sinus rhythm  Right bundle branch block  When compared with ECG of 31-Aug-2023 09:11,  QRS axis shifted right  Confirmed by Sara Mahoney ([id number]) on 08/24/2024 8:13:58 AM        The ASCVD Risk score (Arnett DK, et al., 2019) failed to calculate for the following reasons:    The valid systolic blood pressure range is 90 to 200 mmHg    The valid total cholesterol range is 130 to 320 mg/dL    Problems  Addressed Today  Encounter Diagnoses   Name Primary?    Coronary artery disease involving native coronary artery of native heart without angina pectoris Yes    S/P CABG x 5     Primary hypertension     Mixed hyperlipidemia     Palpitations     Cardiomyopathy, unspecified type (CMS-HCC)     Type 2 diabetes mellitus without complications, unspecified whether long term insulin use (CMS-HCC)             Early onset coronary artery disease status post CABG with a LIMA to LAD, SVG to D1 and OM1, and SVG to PLV and PDA at the age of 29 on 12/04/2008, with a recent non-STEMI on 08/20/2024, with a CTO of the SVG to RCA in the setting of an RCA CTO.  Follow-up angiogram on 10/10/2024 was similar and she was treated medically.  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  She has silent ischemia.  Postop atrial fibrillation after bypass surgery, she has not had recurrence of atrial fibrillation since then.  Right bundle branch block.  Hypertension.  Dyslipidemia.  Diabetes.  Nonproliferative diabetic retinopathy.  Prior diabetic foot infection.  Elevated BMI.  Depression.  Anxiety.  PVD.  Prior palpitations.      In follow-up today, she is hypotensive with her valsartan .  I gave her specific instructions for when to cut this in half and when to take a full dose based on her blood pressures.    I do think part of the issue is that she has been on Mounjaro  for nearly a year and lost about 75 pounds.  Her current dose seems to be overwhelming her, where she is having a hard time eating normal meals and staying hydrated.  I am reducing her dose to 10 mg/weekly.  I have asked her to reach out to me and let me know how she is doing with this, as we could further down titrate to help give her a little bit more energy and hopefully more blood pressure room.    Stop metformin , as her A1c is now beautifully well-controlled at 5.2%.  Repeat hemoglobin A1c in 3 months time.    Continue to work on increasing exercise as tolerated    Return to follow-up with Dr. Lazarus in 6 months, or she can see me sooner if having any new symptoms or concerns    Sara JONELLE Deer, APRN-NP      Total Time Today was 30 minutes in the following activities: Preparing to see the patient, Obtaining and/or reviewing separately obtained history, Performing a medically appropriate examination and/or evaluation, Counseling and educating the patient/family/caregiver, Ordering medications, tests, or procedures, Documenting clinical information in the electronic or other health record, and Independently interpreting results (not separately reported) and communicating results to the patient/family/caregiver    This note was partially dictated using Dragon Medical One speech recognition software.  Occasional wrong-word or sound-alike substitutions may have occurred due to the inherent limitations of voice-recognition software.  Please read the chart carefully and recognize, using context, where the substitutions may have occurred.  Please do not hesitate to contact me for clarification. Past Medical History  Patient Active Problem List    Diagnosis Date Noted    Chest pain 11/27/2024    Obesity 06/09/2021    Peripheral arterial occlusive disease 06/09/2021     04/07/15 - Resting ABI: Mildly reduced ABI's bilaterally. Right: moderately reduced TBI. Left: mildly reduced TBI  07/18/17 - BLE Venous US : No  evidence of acute femoral/popliteal DVT in the bilateral lower extremities.   03/12/21 - RLE venous US  ([facility]ia [employer]): No evidence of right lower extremity DVT. RLE deep venous system shows normal compressibility with normal response to augmentation and valsalva. No fluid collection or mass detected.   04/03/21  - Ankle Brachial Index ([facility]ia [facility]): Right: normal ABI. Mildly abnormal TBI. Left: Normal ABI & TBI.         PAC (premature atrial contraction) 08/22/2017    Palpitations 07/19/2017     08/13/17 - Event Monitor (14 days): Normal sinus rhythm.  PACs rare. No evidence of atrial fibrillation. 1 event was transmitted with symptoms of dizziness did not correlate with any arrhythmias. Intermittent artifact noted as the patient was shaking or using something that is shaking.  However QRS complexes can be seen much in the right through the artifact.      Diabetic foot infection (CMS-HCC) 04/05/2015     04/07/15 ABI's:  Mildly reduced ABI's bilaterally.  Right TBI moderately reduced and left TBI mildly reduced.        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 She describes worrying/anxiety cycles and she indicates symptoms of social phobia  10/11/13 Restarted Wellbutrin       Overweight 11/06/2009    Postoperative atrial fibrillation (CMS-HCC) 12/26/2008     S/p CABG x5 (12/26/2008).       S/P CABG x 5 12/26/2008     12/26/2008 - CABG x5 (Dr Tess)      Coronary artery disease involving native coronary artery of native heart without angina pectoris 12/24/2008     A.  10/16/2008 Ex Echo EF 60%, RV size and function normal, no significant valvular abnormalities, exercised 6 minutes and 30 seconds, achieved a peak heart rate of 172 which is 98% of the maximum predicted heart rate, there were ischemic changes on the ECG, the exercise echocardiogram showed LAD and RCA ischemia.  B.  12/24/2008 LHC LVEDP 10 to 14 mmHg, left main normal, LAD 60 to 70% stenosis followed by 95% stenosis, D1 had 95% stenosis, left circumflex had an OM1 with a 60 to 70% stenosis, the RCA had a mid 50% stenosis, the PDA had a 95% stenosis, followed by 60% stenosis, the PLV had a 95% stenosis.  C.  12/24/2008 CABG with Dr Tess  with a LIMA to LAD, SVG to D1 and OM1, and SVG to PLV and PDA   D.  01/15/2010 Ex Echo EF 55%, RV function normal, valvular structures not well-seen, exercised 7 minutes and 10 seconds, achieved a peak heart rate of 156 which was 90% of the maximum predicted heart rate, no evidence of ischemia.  E.  10/13/2011 Ex Echo EF 60%, RV size and function normal, trace MR, exercised 6 minutes and 18 seconds, achieved a heart rate of 122 which was 70% of the maximum predicted heart rate, PVCs noted, no evidence of ischemia at the workload achieved.  Overall, it was submaximal.  F.  02/15/2014 Echo EF 60%, RV size and function normal, trace MR, aorta normal in size, no pericardial effusion.  G.  05/09/2015 MPI EF 67%, EDV 66 mL, pulmonary to myocardial count  ratio 0.36, no transient ischemic dilatation, no evidence of ischemia.  Overall considered low risk.  H.  07/15/2017 MPI EF 67%, EDV 54 mL, pulmonary to myocardial count ratio 0.25, no transient ischemic dilatation, no evidence of ischemia.  Overall considered low risk.  I.    09/14/2023 MPI EF 71%, EDV 91 mL, the perfusion pattern showed a small fixed defect involving the apex felt to be due to soft tissue attenuation, definite ischemic change was not noted.  J.  10/11/2023 Echo EF 55 to 60%, diastolic function normal, RV size and function normal, nonspecific mitral valve thickening with mild MR, trace TR, aortic valve sclerosis without stenosis, trace AI, the aortic root was 3.2 cm, ascending aorta is 3.5 cm, there is no pericardial effusion, the right atrial pressure is 3 mmHg, the peak pulmonary pressures 30 mmHg.  K.  08/20/2024 Echo at [facility] in [place]  EF 40%, there is mild diastolic dysfunction, there is diffuse hypokinesis, the RV size and function appeared normal, there is aortic valve sclerosis without stenosis, there was moderate MR, trace TR, aortic root was normal in size, there is no pericardial effusion.    L.  08/20/2024 MPI at [facility] in [place]  EF 41%, the end-diastolic volume was 107 mL, overall the perfusion imaging was felt to be nondiagnostic.    M.  08/21/2024 LHC at [facility] in [place]  he left main was patent, the LAD had a CTO, the left circumflex had severe disease, the RCA had a CTO, the LIMA to LAD was patent, the SVG to OM was patent, the SVG to RCA had a CTO.  The abdominal aortogram and distal runoff showed severe calcification of the right femoral artery, the renal arteries were patent, the distal aorta is mildly diseased, patent bilateral external and common iliac arteries, moderate disease in the right common femoral artery mild to moderate disease in the left common femoral artery.  She was treated medically.    N.  10/10/2024 LHC the LVEDP was 8 mmHg, the left main had mild to moderate plaquing, the LAD had a proximal 90% stenosis, there is a small first diagonal branch with severe disease, the left circumflex had an OM1 with a CTO, the OM 2 had irregularities, the RCA had a mid CTO, the LIMA to the diagonal branch was patent, the SVG to OM1 was patent, the SVG to RCA had a CTO.  At that time medical therapy was continued, consideration of a PCI to the CTO to the RCA could be considered in the future.    O.  11/28/2024 Echo EF 35-40%, the end-diastolic dimension was 5.5 cm, end-diastolic volume index was 87 mL/m?, the RV was mildly dilated with preserved function, mild to moderate MR, trace TR, aortic valve sclerosis without stenosis, trace AI, aorta is normal in size, no pericardial effusion, right atrial pressure 3 mmHg, peak pulmonary pressures unable to be calculated.      HLD (hyperlipidemia) 12/24/2008             Depression 12/24/2008    Diabetes mellitus, type II (CMS-HCC) 02/07/2008     Medications  Linagliptin-metformin  2.5-1000mg  BID, glimepiride 2mg  QDay   ACE/ARB: Losartan  50mg  QDay    Dilated Eye Exam (q1y):   08/2014 Signs of nonproliferative diabetic retinopathy     Foot Exam (q1y):   09/04/14   Right foot: 6/10  Left foot: 5/10    Dental Exam (q62m):   Performs regularly    Labs   Last A1c (  q43m):   Lab Results   Component Value Date    HGBA1C 10.8 06/26/2010    HGBA1C 8.8 12/24/2008    HGBA1C 7.5 11/12/2008    A1C 7.5 03/24/2015    A1C 9.4 09/04/2014    A1C 7.6 02/06/2014    HGBPOC 9.5 12/25/2008    HGBPOC 9.2 12/25/2008    HGBPOC 8.8 12/25/2008       Microalbumin (q1y):   MICROALBUMIN/CR RATIO URINE   Date Value Ref Range Status   03/17/2015 12.50 <30 ug/mg Final     Comment:     NOTE NEW REFERENCE RANGES   10/19/2013 31.32 MG/GRAM Final   06/26/2010 9.93 MG/GRAM Final     MICROALBUMIN, RANDOM   Date Value Ref Range Status   03/17/2015 7.0 <19 MCG/ML Final   10/19/2013 21.3* <19 MCG/ML Final   06/26/2010 15.0 <19 MCG/ML Final        Lipid profile (q1y):   Lab Results   Component Value Date    CHOL 152 01/08/2014    TRIG 293* 01/08/2014    HDL 37* 01/08/2014    LDL 84 01/08/2014    VLDL 59 01/08/2014    NONHDLCHOL 115 01/08/2014        Immunizations:   Pneumovax/Prevnar: Pneumovax 11/2013  Flu: Receives annually at work  TDap: Not up to date  Hep B (15-59 yo): 09/04/14  Zoster ( 88 yo or older): Not indicated yet    BP: Controlled  Smoking: Denies    Aspirin  81mg     Atorvastatin  40mg  QDay      Primary hypertension 02/07/2008         ROS    Physical Exam   Vitals reviewed.  Constitutional: She appears well-developed.   HENT:   Head: Normocephalic and atraumatic.   Nose: Nose normal.   Cardiovascular: Normal rate, regular rhythm, normal heart sounds and normal pulses.   Pulmonary/Chest: Effort normal and breath sounds normal.   Abdominal: Normal appearance and bowel sounds are normal.   Musculoskeletal:      Right lower leg: No edema.      Left lower leg: No edema.   Neurological: She is alert and oriented to person, place, and time.   Skin: Skin is warm and dry. Capillary refill takes less than 2 seconds.   Psychiatric: Her behavior is normal. Mood, judgment and thought content normal.       Cardiovascular Health Factors  Vitals BP Readings from Last 3 Encounters:   01/17/25 (!) 88/55   12/13/24 104/80   11/29/24 115/74     Wt Readings from Last 3 Encounters:   01/17/25 61.2 kg (135 lb)   12/13/24 64.9 kg (143 lb 1.6 oz)   11/29/24 64.7 kg (142 lb 9.6 oz)     BMI Readings from Last 3 Encounters:   01/17/25 23.17 kg/m?   12/13/24 24.56 kg/m?   11/29/24 24.48 kg/m?      Smoking Tobacco Use History[1]   Lipid Profile Cholesterol   Date Value Ref Range Status   11/28/2024 90 <200 mg/dL Final     HDL   Date Value Ref Range Status   11/28/2024 39 (L) >40 mg/dL Final     LDL   Date Value Ref Range Status   11/28/2024 37.91 <100.00 mg/dL Final     Triglycerides   Date Value Ref Range Status   11/28/2024 106 <150 mg/dL Final      Blood Sugar Hemoglobin A1C   Date Value Ref Range Status  11/28/2024 5.2 4.0 - 5.7 % Final     Comment:     The ADA recommends that most patients with type 1 and type 2 diabetes maintain an A1c level <7%.     Glucose   Date Value Ref Range Status   12/29/2024 106 (H) 70 - 100 mg/dL Final   87/96/7974 86 70 - 100 mg/dL Final   89/93/7974 85 70 - 100 mg/dL Final     Glucose, POC   Date Value Ref Range Status   11/28/2024 118 (H) 70 - 100 mg/dL Final   87/96/7974 86 70 - 100 mg/dL Final   87/96/7974 80 70 - 100 mg/dL Final                 Current Medications (including today's revisions)   aspirin  EC 81 mg tablet Take 1 tablet by mouth daily. Take with food.    atorvastatin  (LIPITOR) 80 mg tablet Take one tablet by mouth daily.    busPIRone (BUSPAR) 5 mg tablet Take one tablet by mouth twice daily.    clopiDOGreL  (PLAVIX ) 75 mg tablet Take one tablet by mouth daily.    DOCUSATE SODIUM PO Take 3 tablets by mouth daily.    estradioL  (ESTRACE ) 0.01 % (0.1 mg/g) vaginal cream Insert or Apply one g to vaginal area three times weekly.    ezetimibe  (ZETIA ) 10 mg tablet Take one tablet by mouth daily.    famotidine -Ca Carb-Mag Hydrox (PEPCID  COMPLETE) 10-800-165 mg tablet Chew one tablet by mouth daily.    fosfomycin  tromethamine  (MONUROL ) 3 gram packet Take one packet by mouth every 72 hours. Indications: genitourinary tract infections    furosemide  (LASIX ) 20 mg tablet Take one tablet by mouth as Needed. For weight gain or edema    methenamine  hippurate (HIPREX ) 1 gram tablet Take one tablet by mouth twice daily.    metoprolol  succinate XL (TOPROL  XL) 25 mg extended release tablet Take one tablet by mouth daily. Indications: chronic heart failure    Miscellaneous Medical Supply misc Please check your blood pressure twice a day    multivit-min/iron /folic acid/K (BARIATRIC MULTIVITAMINS PO) Take 1 tablet by mouth daily.    pantoprazole  DR (PROTONIX ) 40 mg tablet Take one tablet by mouth twice daily. Indications: heartburn    ranolazine  ER (RANEXA ) 500 mg tablet Take one tablet by mouth twice daily.    spironolactone  (ALDACTONE ) 25 mg tablet Take one-half tablet by mouth daily. Take with food.    tirzepatide  (MOUNJARO ) 10 mg/0.5 mL injector PEN Inject 0.5 mL under the skin every 7 days.    valsartan  (DIOVAN ) 40 mg tablet Take one tablet by mouth daily.                 [1]   Social History  Tobacco Use   Smoking Status Never   Smokeless Tobacco Never

## 2025-01-18 DIAGNOSIS — I429 Cardiomyopathy, unspecified: Secondary | ICD-10-CM

## 2025-01-18 DIAGNOSIS — Z951 Presence of aortocoronary bypass graft: Secondary | ICD-10-CM

## 2025-01-18 DIAGNOSIS — E119 Type 2 diabetes mellitus without complications: Secondary | ICD-10-CM

## 2025-01-18 DIAGNOSIS — I10 Essential (primary) hypertension: Secondary | ICD-10-CM

## 2025-01-24 ENCOUNTER — Encounter: Admit: 2025-01-24 | Discharge: 2025-01-24 | Payer: PRIVATE HEALTH INSURANCE | Primary: Family

## 2025-01-28 ENCOUNTER — Encounter: Admit: 2025-01-28 | Discharge: 2025-01-28 | Payer: PRIVATE HEALTH INSURANCE | Primary: Family

## 2025-01-28 DIAGNOSIS — R399 Unspecified symptoms and signs involving the genitourinary system: Principal | ICD-10-CM

## 2025-01-28 DIAGNOSIS — N390 Urinary tract infection, site not specified: Secondary | ICD-10-CM

## 2025-01-28 MED ORDER — METHENAMINE HIPPURATE 1 GRAM PO TAB
1 g | ORAL_TABLET | Freq: Two times a day (BID) | ORAL | 3 refills | 90.00000 days | Status: AC
Start: 2025-01-28 — End: ?

## 2025-01-29 ENCOUNTER — Encounter: Admit: 2025-01-29 | Discharge: 2025-01-29 | Payer: PRIVATE HEALTH INSURANCE | Primary: Family

## 2025-01-29 NOTE — Telephone Encounter [36]
 01/29/2025 4:55 PM   BP and symptom update sent to IT for review.   Me to Sara Mahoney 01/29/25  4:55 PM  Thank you for the update. I will get back to you with any changes or recommendations Sara Mahoney or Dr. Lazarus has.      Thank you for choosing [facility] for your cardiology care.      Benjaman Knee, Sara Mahoney   [phone]      Sara Mahoney to Sara Mahoney Nurse [facility] (Selected Message) 01/29/25  4:44 PM  Felt tired for awhile now. Not sure if it increased with the meds or not.    Me to Sara Mahoney 01/24/25  4:40 PM  Do you feel that the fatigue (tired feeling) is worse with the higher dose of medication, or have you been experiencing this feeling for a while now?    Sara Mahoney to GANNETT CO [facility] 01/24/25  4:31 PM  I have dizzy spells now and then, maybe once or twice a day.   Mainly happens if I get up too quickly from a seated position.  Mostly I am just tired all the time.    Sara Laurel, RN to [employer]   01/24/25  8:37 AM  Good morning Sara,      Thank you for sending these to us .      How do you feel with these blood pressures? They are a little on the lower side. Do you have any dizziness, lightheadedness, fatigue?      Thank you,  -Kristin, RN      Sara Mahoney to P [facility] Nurse [facility] 01/24/25  7:42 AM  I was instructed to record my blood pressure prior to taking my morning meds. If the top reading was below 110 to take 1/2 of Valsartan . If higher, take a full dose.  Here are my readings for the past week.  103/63  94/54  87/53  98/58  94/62  107/68  105/68

## 2025-01-31 ENCOUNTER — Encounter: Admit: 2025-01-31 | Discharge: 2025-01-31 | Payer: PRIVATE HEALTH INSURANCE | Primary: Family

## 2025-01-31 MED ORDER — ATORVASTATIN 80 MG PO TAB
80 mg | ORAL_TABLET | Freq: Every day | ORAL | 1 refills | 90.00000 days | Status: AC
Start: 2025-01-31 — End: ?

## 2025-01-31 NOTE — Telephone Encounter [36]
 01/31/2025 8:56 AM     Refill request received for Atorvastatin  80mg  daily from [facility]. Protocol reviewed. Refill authorized.
